# Patient Record
Sex: Male | Born: 1960
Health system: Southern US, Community
[De-identification: ages and names within clinical notes are randomized; demographics above are authoritative.]

## PROBLEM LIST (undated history)

## (undated) DIAGNOSIS — G35 Multiple sclerosis: Secondary | ICD-10-CM

## (undated) HISTORY — PX: NO PAST SURGERIES: SHX2092

---

## 2010-04-13 ENCOUNTER — Ambulatory Visit: Payer: Self-pay | Admitting: Internal Medicine

## 2011-11-30 ENCOUNTER — Ambulatory Visit: Payer: Self-pay | Admitting: Internal Medicine

## 2012-02-11 DIAGNOSIS — G35 Multiple sclerosis: Secondary | ICD-10-CM | POA: Insufficient documentation

## 2015-08-24 ENCOUNTER — Ambulatory Visit: Payer: 59

## 2015-08-24 ENCOUNTER — Ambulatory Visit
Admission: EM | Admit: 2015-08-24 | Discharge: 2015-08-24 | Disposition: A | Discharge status: Home / Self Care | Payer: 59 | Attending: Family Medicine | Admitting: Family Medicine

## 2015-08-24 ENCOUNTER — Encounter: Payer: Self-pay | Admitting: Emergency Medicine

## 2015-08-24 DIAGNOSIS — J40 Bronchitis, not specified as acute or chronic: Secondary | ICD-10-CM

## 2015-08-24 DIAGNOSIS — J0110 Acute frontal sinusitis, unspecified: Secondary | ICD-10-CM

## 2015-08-24 HISTORY — DX: Multiple sclerosis: G35

## 2015-08-24 MED ORDER — AZITHROMYCIN 250 MG PO TABS: ORAL_TABLET | ORAL | Status: DC

## 2015-08-24 MED ORDER — BENZONATATE 100 MG PO CAPS: 100.0000 mg | ORAL_CAPSULE | Freq: Three times a day (TID) | ORAL | Status: DC | PRN

## 2015-08-24 NOTE — Discharge Instructions (Signed)
Take medication as prescribed. Rest. Drink plenty of water.   Follow up with your primary care physician this week as needed. Return to Urgent care for new or worsening concerns.   Sinusitis Sinusitis is redness, soreness, and inflammation of the paranasal sinuses. Paranasal sinuses are air pockets within the bones of your face (beneath the eyes, the middle of the forehead, or above the eyes). In healthy paranasal sinuses, mucus is able to drain out, and air is able to circulate through them by way of your nose. However, when your paranasal sinuses are inflamed, mucus and air can become trapped. This can allow bacteria and other germs to grow and cause infection. Sinusitis can develop quickly and last only a short time (acute) or continue over a long period (chronic). Sinusitis that lasts for more than 12 weeks is considered chronic.  CAUSES  Causes of sinusitis include:  Allergies.  Structural abnormalities, such as displacement of the cartilage that separates your nostrils (deviated septum), which can decrease the air flow through your nose and sinuses and affect sinus drainage.  Functional abnormalities, such as when the small hairs (cilia) that line your sinuses and help remove mucus do not work properly or are not present. SIGNS AND SYMPTOMS  Symptoms of acute and chronic sinusitis are the same. The primary symptoms are pain and pressure around the affected sinuses. Other symptoms include:  Upper toothache.  Earache.  Headache.  Bad breath.  Decreased sense of smell and taste.  A cough, which worsens when you are lying flat.  Fatigue.  Fever.  Thick drainage from your nose, which often is green and may contain pus (purulent).  Swelling and warmth over the affected sinuses. DIAGNOSIS  Your health care provider will perform a physical exam. During the exam, your health care provider may:  Look in your nose for signs of abnormal growths in your nostrils (nasal  polyps).  Tap over the affected sinus to check for signs of infection.  View the inside of your sinuses (endoscopy) using an imaging device that has a light attached (endoscope). If your health care provider suspects that you have chronic sinusitis, one or more of the following tests may be recommended:  Allergy tests.  Nasal culture. A sample of mucus is taken from your nose, sent to a lab, and screened for bacteria.  Nasal cytology. A sample of mucus is taken from your nose and examined by your health care provider to determine if your sinusitis is related to an allergy. TREATMENT  Most cases of acute sinusitis are related to a viral infection and will resolve on their own within 10 days. Sometimes medicines are prescribed to help relieve symptoms (pain medicine, decongestants, nasal steroid sprays, or saline sprays).  However, for sinusitis related to a bacterial infection, your health care provider will prescribe antibiotic medicines. These are medicines that will help kill the bacteria causing the infection.  Rarely, sinusitis is caused by a fungal infection. In theses cases, your health care provider will prescribe antifungal medicine. For some cases of chronic sinusitis, surgery is needed. Generally, these are cases in which sinusitis recurs more than 3 times per year, despite other treatments. HOME CARE INSTRUCTIONS   Drink plenty of water. Water helps thin the mucus so your sinuses can drain more easily.  Use a humidifier.  Inhale steam 3 to 4 times a day (for example, sit in the bathroom with the shower running).  Apply a warm, moist washcloth to your face 3 to 4 times a  day, or as directed by your health care provider.  Use saline nasal sprays to help moisten and clean your sinuses.  Take medicines only as directed by your health care provider.  If you were prescribed either an antibiotic or antifungal medicine, finish it all even if you start to feel better. SEEK IMMEDIATE  MEDICAL CARE IF:  You have increasing pain or severe headaches.  You have nausea, vomiting, or drowsiness.  You have swelling around your face.  You have vision problems.  You have a stiff neck.  You have difficulty breathing. MAKE SURE YOU:   Understand these instructions.  Will watch your condition.  Will get help right away if you are not doing well or get worse. Document Released: 12/03/2005 Document Revised: 04/19/2014 Document Reviewed: 12/18/2011 [facility] Patient Information 2015 [place], [place]. This information is not intended to replace advice given to you by your health care provider. Make sure you discuss any questions you have with your health care provider.

## 2015-08-24 NOTE — ED Provider Notes (Signed)
[facility] Emergency Department Provider Note  ____________________________________________  Time seen: Approximately 8:14 AM  I have reviewed the triage vital signs and the nursing notes.   HISTORY  Chief Complaint Nasal Congestion   HPI Larry Diaz is a 54 y.o. male  presents with complaints of 2 weeks of runny nose, congestion and sinus pressure. Patient reports that intermittent over last several days with increased nasal drainage and cough. States he has chest congestion. Denies wheezes. Denies chest pain or shortness of breath. Denies fever. Reports continues to eat and drink well. Reports taking over-the-counter Mucinex without any relief. States also using Nettie pot which helps but no relief.   Past Medical History  Diagnosis Date  . MS (multiple sclerosis)     There are no active problems to display for this patient.   History reviewed. No pertinent past surgical history.  Current Outpatient Rx  Name  Route  Sig  Dispense  Refill  . Dimethyl Fumarate (TECFIDERA PO)   Oral   Take 120 mg by mouth.           Allergies Review of patient's allergies indicates no known allergies.  History reviewed. No pertinent family history.  Social History Social History  Substance Use Topics  . Smoking status: Never Smoker   . Smokeless tobacco: None  . Alcohol Use: Yes    Review of Systems Constitutional: No fever/chills Eyes: No visual changes. ENT: positive for runny nose, cough and congestion.  Cardiovascular: Denies chest pain. Respiratory: Denies shortness of breath. Gastrointestinal: No abdominal pain.  No nausea, no vomiting.  No diarrhea.  No constipation. Genitourinary: Negative for dysuria. Musculoskeletal: Negative for back pain. Skin: Negative for rash. Neurological: Negative for headaches, focal weakness or numbness.  10-point ROS otherwise negative.  ____________________________________________   PHYSICAL  EXAM:  VITAL SIGNS: ED Triage Vitals  Enc Vitals Group     BP 08/24/15 0809 110/58 mmHg     Pulse Rate 08/24/15 0809 71     Resp 08/24/15 0809 20     Temp 08/24/15 0809 98.7 F (37.1 C)     Temp Source 08/24/15 0809 Tympanic     SpO2 08/24/15 0809 100 %     Weight 08/24/15 0809 187 lb (84.823 kg)     Height 08/24/15 0809 5' 9.5" (1.765 m)     Head Cir --      Peak Flow --      Pain Score 08/24/15 0814 0     Pain Loc --      Pain Edu? --      Excl. in GC? --     Constitutional: Alert and oriented. Well appearing and in no acute distress. Eyes: Conjunctivae are normal. PERRL. EOMI. Head: Atraumatic.  Ears: no erythema, normal TMs bilaterally.   Nose: mild clear rhinorrhea, bilateral nare turbinate edema, nares patent.   Mouth/Throat: Mucous membranes are moist.  Oropharynx non-erythematous. Neck: No stridor.  No cervical spine tenderness to palpation. Hematological/Lymphatic/Immunilogical: No cervical lymphadenopathy. Cardiovascular: Normal rate, regular rhythm. Grossly normal heart sounds.  Good peripheral circulation. Respiratory: Normal respiratory effort.  No retractions. Mild bilateral base rhonchi, improves with cough, no wheezes, no rales.  Gastrointestinal: Soft and nontender. No distention. Normal Bowel sounds.  No abdominal bruits. No CVA tenderness. Musculoskeletal: No lower or upper extremity tenderness nor edema.  Neurologic:  Normal speech and language. No gross focal neurologic deficits are appreciated. No gait instability. Skin:  Skin is warm, dry and intact. No rash noted.  Psychiatric: Mood and affect are normal. Speech and behavior are normal.  ____________________________________________   LABS (all labs ordered are listed, but only abnormal results are displayed)  Labs Reviewed - No data to display  RADIOLOGY  EXAM: CHEST 2 VIEW  COMPARISON: None.  FINDINGS: The heart size and mediastinal contours are within normal limits. Both lungs are  clear. No pneumothorax or pleural effusion is noted. The visualized skeletal structures are unremarkable.  IMPRESSION: No active cardiopulmonary disease.   Electronically Signed By: Lupita Raider, M.D. On: 08/24/2015 08:35  I, Renford Dills, personally viewed and evaluated these images (plain radiographs) as part of my medical decision making.  _______________________________________   INITIAL IMPRESSION / ASSESSMENT AND PLAN / ED COURSE  Pertinent labs & imaging results that were available during my care of the patient were reviewed by me and considered in my medical decision making (see chart for details).  Very well appearing. No acute distress. Presents for runny nose, congestion, sinus pressure and intermittent cough. Chest xray negative for acute cardiopulmonary disease. Will treat sinusitis and bronchitis with azithromycin and prn tessalon perles. Discussed follow up and return parameters.Follow up with PCP this week as needed. Patient verbalized understanding and agreed to plan.  ____________________________________________   FINAL CLINICAL IMPRESSION(S) / ED DIAGNOSES  Final diagnoses:  Acute frontal sinusitis, recurrence not specified  Bronchitis       Renford Dills, NP 08/24/15 [phone]

## 2015-08-24 NOTE — ED Notes (Signed)
Pt with cold s/s x days and nasal congestion

## 2015-12-19 DIAGNOSIS — Z0120 Encounter for dental examination and cleaning without abnormal findings: Secondary | ICD-10-CM | POA: Diagnosis not present

## 2016-04-03 DIAGNOSIS — G35 Multiple sclerosis: Secondary | ICD-10-CM | POA: Diagnosis not present

## 2016-04-03 DIAGNOSIS — Z79899 Other long term (current) drug therapy: Secondary | ICD-10-CM | POA: Diagnosis not present

## 2016-06-14 ENCOUNTER — Other Ambulatory Visit: Payer: Self-pay | Admitting: Pharmacist

## 2016-07-19 ENCOUNTER — Other Ambulatory Visit: Payer: Self-pay | Admitting: Pharmacist

## 2016-07-19 MED ORDER — INTERFERON BETA-1A 44 MCG/0.5ML ~~LOC~~ SOSY: 44.0000 ug | PREFILLED_SYRINGE | SUBCUTANEOUS | 6 refills | Status: DC

## 2016-07-20 ENCOUNTER — Ambulatory Visit (HOSPITAL_BASED_OUTPATIENT_CLINIC_OR_DEPARTMENT_OTHER): Payer: Self-pay | Admitting: Pharmacist

## 2016-07-20 DIAGNOSIS — G35 Multiple sclerosis: Secondary | ICD-10-CM

## 2016-07-20 MED FILL — REBIF 44 MCG/0.5ML SOSY: 44 | 28 days supply | Qty: 6 | Fill #0

## 2016-07-20 NOTE — Progress Notes (Signed)
   S: Patient presents today to the [facility].  Patient is currently taking Rebif for multiple sclerosis. Patient is managed by Dr. Dan Humphreys for this. He reports that the Rebif is controlling his MS with no recent relapses. He has tried the oral MS medications but could not tolerate them due to GI side effects.   Adherence: denies any missed doses. He rotates injection sites.  Dosing: Rebif (subQ): Target dose is 44 mcg 3 times weekly (separate doses by at least 48 hours)   Drug-drug interactions: none  Monitoring: CBC: WNL 03/2016 LFTs: WNL 03/2016 Thyroid function tests: last one WNL (2000) Injection site reactions: denies S/sx of malignancy: denies Neuropsychiatric symptoms: denies Thrombotic microangiopathy (monitor for new onset HTN, thrombocytopenia, or impaired renal dysfunction): denies    O:     No results found for: WBC, HGB, HCT, MCV, PLT    Chemistry   No results found for: NA, K, CL, CO2, BUN, CREATININE, GLU No results found for: CALCIUM, ALKPHOS, AST, ALT, BILITOT   See CareEverywhere  A/P: 1. Medication review: patient on Rebif for MS and is tolerating it well with no adverse effects and has improved control of his MS. Reviewed the medication with him, including the following: Rebif, interferon beta-1a, is an interferon indicated for the treatment of MS. Analgesics and/or antipyretics may help decrease flu-like symptoms on treatment days. No recommendation for any changes.    Juanita Craver, PharmD, BCPS, BCACP, CPP Clinical Pharmacist Practitioner  [facility] [phone]

## 2016-08-15 MED FILL — REBIF 44 MCG/0.5ML SOSY: 44 | 28 days supply | Qty: 6 | Fill #1

## 2016-09-12 MED FILL — REBIF 44 MCG/0.5ML SOSY: 44 | 28 days supply | Qty: 6 | Fill #2

## 2016-10-09 DIAGNOSIS — H52223 Regular astigmatism, bilateral: Secondary | ICD-10-CM | POA: Diagnosis not present

## 2016-10-17 MED FILL — REBIF 44 MCG/0.5ML SOSY: 44 | 28 days supply | Qty: 6 | Fill #3

## 2016-11-15 MED FILL — REBIF 44 MCG/0.5ML SOSY: 44 | 28 days supply | Qty: 6 | Fill #4

## 2016-12-12 MED FILL — REBIF 44 MCG/0.5ML SOSY: 44 | 28 days supply | Qty: 6 | Fill #5

## 2017-01-01 DIAGNOSIS — Z79899 Other long term (current) drug therapy: Secondary | ICD-10-CM | POA: Diagnosis not present

## 2017-01-01 DIAGNOSIS — G35 Multiple sclerosis: Secondary | ICD-10-CM | POA: Diagnosis not present

## 2017-01-09 MED FILL — REBIF 44 MCG/0.5ML SOSY: 44 | 28 days supply | Qty: 6 | Fill #6

## 2017-02-05 ENCOUNTER — Other Ambulatory Visit: Payer: Self-pay | Admitting: Pharmacist

## 2017-02-05 MED ORDER — INTERFERON BETA-1A 44 MCG/0.5ML ~~LOC~~ SOSY: 44.0000 ug | PREFILLED_SYRINGE | SUBCUTANEOUS | 11 refills | Status: DC

## 2017-02-05 MED FILL — REBIF 44 MCG/0.5ML SOSY: 44 | 28 days supply | Qty: 6 | Fill #0 | Status: TO

## 2017-03-13 MED FILL — REBIF 44 MCG/0.5ML SOSY: 44 | 28 days supply | Qty: 6 | Fill #0

## 2017-04-10 MED FILL — REBIF 44 MCG/0.5ML SOSY: 44 | 28 days supply | Qty: 6 | Fill #1

## 2017-05-14 MED FILL — REBIF 44 MCG/0.5ML SOSY: 44 | 28 days supply | Qty: 6 | Fill #2

## 2017-06-13 MED FILL — REBIF 44 MCG/0.5ML SOSY: 44 | 28 days supply | Qty: 6 | Fill #3

## 2017-07-09 MED FILL — REBIF 44 MCG/0.5ML SOSY: 44 | 28 days supply | Qty: 6 | Fill #4

## 2017-08-15 MED FILL — REBIF 44 MCG/0.5ML SOSY: 44 | 28 days supply | Qty: 6 | Fill #5

## 2017-09-17 MED FILL — REBIF 44 MCG/0.5ML SOSY: 44 | 28 days supply | Qty: 6 | Fill #6

## 2017-10-28 MED FILL — REBIF 44 MCG/0.5ML SOSY: 44 | 28 days supply | Qty: 6 | Fill #7

## 2017-11-22 MED FILL — REBIF 44 MCG/0.5ML SOSY: 44 | 28 days supply | Qty: 6 | Fill #8

## 2017-12-05 DIAGNOSIS — M545 Low back pain: Secondary | ICD-10-CM | POA: Diagnosis not present

## 2017-12-05 DIAGNOSIS — G35 Multiple sclerosis: Secondary | ICD-10-CM | POA: Diagnosis not present

## 2017-12-05 DIAGNOSIS — M549 Dorsalgia, unspecified: Secondary | ICD-10-CM | POA: Diagnosis not present

## 2017-12-25 MED FILL — REBIF 44 MCG/0.5ML SOSY: 44 | 28 days supply | Qty: 6 | Fill #9

## 2018-01-01 ENCOUNTER — Other Ambulatory Visit: Payer: Self-pay | Admitting: Pharmacist

## 2018-01-01 DIAGNOSIS — Z79899 Other long term (current) drug therapy: Secondary | ICD-10-CM | POA: Diagnosis not present

## 2018-01-01 DIAGNOSIS — G35 Multiple sclerosis: Secondary | ICD-10-CM | POA: Diagnosis not present

## 2018-01-01 MED ORDER — INTERFERON BETA-1A 44 MCG/0.5ML ~~LOC~~ SOSY: 44.0000 ug | PREFILLED_SYRINGE | SUBCUTANEOUS | 11 refills | Status: DC

## 2018-01-01 NOTE — Telephone Encounter (Signed)
Called patient to schedule an appointment for the [facility]. I was unable to reach the patient so I left a HIPAA-compliant message requesting that the patient return my call.   

## 2018-01-03 ENCOUNTER — Ambulatory Visit (HOSPITAL_BASED_OUTPATIENT_CLINIC_OR_DEPARTMENT_OTHER): Payer: 59 | Admitting: Pharmacist

## 2018-01-03 ENCOUNTER — Encounter: Payer: Self-pay | Admitting: Pharmacist

## 2018-01-03 DIAGNOSIS — Z79899 Other long term (current) drug therapy: Secondary | ICD-10-CM

## 2018-01-03 NOTE — Progress Notes (Signed)
   S: Patient presents today to the [facility].  Patient is currently taking Rebif for multiple sclerosis. Patient is managed by Dr. Dan Humphreys for this. He reports that the Rebif is controlling his MS with no recent relapses. Last visit with Dr. Dan Humphreys was two days ago.   He has tried the oral MS medications but could not tolerate them due to GI side effects.   Adherence: denies any missed doses. He rotates injection sites.  Dosing: Rebif (subQ): Target dose is 44 mcg 3 times weekly (separate doses by at least 48 hours)   Drug-drug interactions: none  Monitoring: CBC: WNL 12/2017 LFTs: WNL 12/2017 Thyroid function tests: last one WNL (2000) Injection site reactions: denies S/sx of malignancy: denies Neuropsychiatric symptoms: denies Thrombotic microangiopathy (monitor for new onset HTN, thrombocytopenia, or impaired renal dysfunction): denies    O:     No results found for: WBC, HGB, HCT, MCV, PLT    Chemistry   No results found for: NA, K, CL, CO2, BUN, CREATININE, GLU No results found for: CALCIUM, ALKPHOS, AST, ALT, BILITOT   See CareEverywhere  A/P: 1. Medication review: patient on Rebif for MS and is tolerating it well with no adverse effects and has improved control of his MS. Reviewed the medication with him, including the following: Rebif, interferon beta-1a, is an interferon indicated for the treatment of MS. Analgesics and/or antipyretics may help decrease flu-like symptoms on treatment days. No recommendation for any changes.    Alvino Blood, PharmD, BCPS, BCACP, CPP Clinical Pharmacist Practitioner  [facility] [phone]

## 2018-01-09 ENCOUNTER — Other Ambulatory Visit: Payer: Self-pay | Admitting: Internal Medicine

## 2018-01-09 ENCOUNTER — Encounter: Payer: Self-pay | Admitting: Internal Medicine

## 2018-01-13 ENCOUNTER — Ambulatory Visit (INDEPENDENT_AMBULATORY_CARE_PROVIDER_SITE_OTHER): Payer: 59 | Admitting: Internal Medicine

## 2018-01-13 ENCOUNTER — Encounter: Payer: Self-pay | Admitting: Internal Medicine

## 2018-01-13 VITALS — BP 110/70 | HR 89 | Ht 69.5 in | Wt 204.0 lb

## 2018-01-13 DIAGNOSIS — M62830 Muscle spasm of back: Secondary | ICD-10-CM | POA: Diagnosis not present

## 2018-01-13 DIAGNOSIS — G35 Multiple sclerosis: Secondary | ICD-10-CM

## 2018-01-13 MED ORDER — CYCLOBENZAPRINE HCL 10 MG PO TABS: 10.0000 mg | ORAL_TABLET | Freq: Every day | ORAL | 0 refills | Status: DC

## 2018-01-13 MED ORDER — CYCLOBENZAPRINE HCL 10 MG PO TABS
10.0000 mg | ORAL_TABLET | Freq: Every day | ORAL | 0 refills | Status: DC
Start: 2018-01-13 — End: 2018-01-13

## 2018-01-13 NOTE — Progress Notes (Signed)
Date:  01/13/2018   Name:  Larry Diaz   DOB:  [birth year]   MRN:  [id number]   Chief Complaint: Establish Care (Needs PCP. Has not had one in 15 years. See's [facility] to have MS checked. )  Back Pain  This is a new problem. The current episode started 1 to 4 weeks ago. The problem occurs daily. The problem has been gradually improving since onset. The pain is present in the lumbar spine. The pain does not radiate. Pertinent negatives include no abdominal pain, chest pain, fever or headaches.   MS - has been in remission on current treatment.  Followed by Neurology at [facility].   Review of Systems  Constitutional: Negative for chills, fever and unexpected weight change.  Eyes: Negative for visual disturbance.  Respiratory: Negative for chest tightness, shortness of breath and wheezing.   Cardiovascular: Negative for chest pain and palpitations.  Gastrointestinal: Negative for abdominal pain.  Musculoskeletal: Positive for back pain.  Neurological: Negative for dizziness and headaches.  Hematological: Negative for adenopathy.  Psychiatric/Behavioral: Negative for dysphoric mood.    Patient Active Problem List   Diagnosis Date Noted  . Multiple sclerosis (HCC) 02/11/2012    Prior to Admission medications   Medication Sig Start Date End Date Taking? Authorizing Provider  interferon beta-1a (REBIF) 44 MCG/0.5ML SOSY injection Inject 0.5 mLs (44 mcg total) into the skin 3 (three) times a week. 01/01/18  Yes Quentin Angst, MD  acetaminophen (TYLENOL) 500 MG tablet Take 1 tablet by mouth as needed.    [provider]    No Known Allergies  History reviewed. No pertinent surgical history.  Social History   Tobacco Use  . Smoking status: Never Smoker  . Smokeless tobacco: Never Used  Substance Use Topics  . Alcohol use: Yes    Comment: occasional  . Drug use: No     Medication list has been reviewed and updated.  PHQ 2/9 Scores 01/13/2018  PHQ - 2 Score 0     Physical Exam  Constitutional: He is oriented to person, place, and time. He appears well-developed. No distress.  HENT:  Head: Normocephalic and atraumatic.  Neck: Normal range of motion. Neck supple. No thyromegaly present.  Cardiovascular: Normal rate, regular rhythm and normal heart sounds.  Pulmonary/Chest: Effort normal. No respiratory distress. He has no wheezes.  Musculoskeletal: He exhibits no edema.       Lumbar back: He exhibits decreased range of motion, tenderness, pain and spasm.  Neurological: He is alert and oriented to person, place, and time. He has normal strength and normal reflexes. No cranial nerve deficit or sensory deficit.  Skin: Skin is warm and dry. No rash noted.  Psychiatric: He has a normal mood and affect. His behavior is normal. Thought content normal.  Nursing note and vitals reviewed.   BP 110/70   Pulse 89   Ht 5' 9.5" (1.765 m)   Wt 204 lb (92.5 kg)   SpO2 97%   BMI 29.69 kg/m   Assessment and Plan: 1. Multiple sclerosis (HCC) In remission  2. Muscle spasm of back Continue tylenol, heat - cyclobenzaprine (FLEXERIL) 10 MG tablet; Take 1 tablet (10 mg total) by mouth at bedtime.  Dispense: 30 tablet; Refill: 0    Meds ordered this encounter  Medications  . DISCONTD: cyclobenzaprine (FLEXERIL) 10 MG tablet    Sig: Take 1 tablet (10 mg total) by mouth at bedtime.    Dispense:  30 tablet  Refill:  0  . cyclobenzaprine (FLEXERIL) 10 MG tablet    Sig: Take 1 tablet (10 mg total) by mouth at bedtime.    Dispense:  30 tablet    Refill:  0    Partially dictated using Animal nutritionist. Any errors are unintentional.  Bari Edward, MD [facility]  01/13/2018

## 2018-01-22 ENCOUNTER — Encounter: Payer: Self-pay | Admitting: Internal Medicine

## 2018-01-22 ENCOUNTER — Ambulatory Visit (INDEPENDENT_AMBULATORY_CARE_PROVIDER_SITE_OTHER): Payer: 59 | Admitting: Internal Medicine

## 2018-01-22 VITALS — BP 104/64 | HR 63 | Ht 69.5 in | Wt 200.0 lb

## 2018-01-22 DIAGNOSIS — Z1159 Encounter for screening for other viral diseases: Secondary | ICD-10-CM

## 2018-01-22 DIAGNOSIS — Z1211 Encounter for screening for malignant neoplasm of colon: Secondary | ICD-10-CM

## 2018-01-22 DIAGNOSIS — Z125 Encounter for screening for malignant neoplasm of prostate: Secondary | ICD-10-CM

## 2018-01-22 DIAGNOSIS — Z0000 Encounter for general adult medical examination without abnormal findings: Secondary | ICD-10-CM | POA: Diagnosis not present

## 2018-01-22 DIAGNOSIS — G35 Multiple sclerosis: Secondary | ICD-10-CM

## 2018-01-22 LAB — POCT URINALYSIS DIPSTICK
Bilirubin, UA: NEGATIVE
GLUCOSE UA: NEGATIVE
Ketones, UA: NEGATIVE
LEUKOCYTES UA: NEGATIVE
Nitrite, UA: NEGATIVE
Protein, UA: NEGATIVE
RBC UA: NEGATIVE
SPEC GRAV UA: 1.02 (ref 1.010–1.025)
UROBILINOGEN UA: 0.2 U/dL
pH, UA: 6 (ref 5.0–8.0)

## 2018-01-22 NOTE — Patient Instructions (Signed)

## 2018-01-22 NOTE — Progress Notes (Signed)
Date:  01/22/2018   Name:  Larry Diaz   DOB:  Jan 25, 1961   MRN:  [id number]   Chief Complaint: Annual Exam Larry Diaz is a 57 y.o. male who presents today for his Complete Annual Exam. He feels well. He reports exercising regularly with physical job. He reports he is sleeping well.    Review of Systems  Constitutional: Negative for chills, fever and unexpected weight change.  HENT: Negative for hearing loss and trouble swallowing.   Eyes: Negative for visual disturbance.  Respiratory: Negative for chest tightness, shortness of breath and wheezing.   Cardiovascular: Negative for chest pain and palpitations.  Gastrointestinal: Negative for abdominal pain, blood in stool, constipation and diarrhea.  Genitourinary: Negative for difficulty urinating, hematuria and urgency.  Musculoskeletal: Positive for back pain (intermittent low back ache). Negative for arthralgias and myalgias.  Skin: Negative for color change and rash.  Allergic/Immunologic: Negative for environmental allergies.  Neurological: Negative for dizziness and headaches.  Hematological: Negative for adenopathy.  Psychiatric/Behavioral: Negative for dysphoric mood and sleep disturbance.    Patient Active Problem List   Diagnosis Date Noted  . Multiple sclerosis (HCC) 02/11/2012    Prior to Admission medications   Medication Sig Start Date End Date Taking? Authorizing Provider  acetaminophen (TYLENOL) 500 MG tablet Take 1 tablet by mouth as needed.    [provider]  cyclobenzaprine (FLEXERIL) 10 MG tablet Take 1 tablet (10 mg total) by mouth at bedtime. 01/13/18   Reubin Milan, MD  interferon beta-1a (REBIF) 44 MCG/0.5ML SOSY injection Inject 0.5 mLs (44 mcg total) into the skin 3 (three) times a week. 01/01/18   Quentin Angst, MD    No Known Allergies  History reviewed. No pertinent surgical history.  Social History   Tobacco Use  . Smoking status: Never Smoker  . Smokeless tobacco:  Never Used  Substance Use Topics  . Alcohol use: Yes    Comment: occasional  . Drug use: No     Medication list has been reviewed and updated.  PHQ 2/9 Scores 01/13/2018  PHQ - 2 Score 0    Physical Exam  Constitutional: He is oriented to person, place, and time. He appears well-developed and well-nourished. No distress.  HENT:  Head: Normocephalic and atraumatic.  Right Ear: Tympanic membrane, external ear and ear canal normal.  Left Ear: Tympanic membrane, external ear and ear canal normal.  Nose: Nose normal.  Mouth/Throat: Uvula is midline and oropharynx is clear and moist.  Eyes: Conjunctivae and EOM are normal. Pupils are equal, round, and reactive to light.  Neck: Normal range of motion. Neck supple. Carotid bruit is not present. No thyromegaly present.  Cardiovascular: Normal rate, regular rhythm, normal heart sounds and intact distal pulses.  Pulmonary/Chest: Effort normal and breath sounds normal. No respiratory distress. He has no wheezes. Right breast exhibits no mass. Left breast exhibits no mass.  Abdominal: Soft. Normal appearance and bowel sounds are normal. There is no hepatosplenomegaly. There is no tenderness.  Musculoskeletal: Normal range of motion.  Lymphadenopathy:    He has no cervical adenopathy.  Neurological: He is alert and oriented to person, place, and time. He has normal reflexes.  Skin: Skin is warm, dry and intact. No rash noted.  Psychiatric: He has a normal mood and affect. His speech is normal and behavior is normal. Judgment and thought content normal.  Nursing note and vitals reviewed.   BP 104/64   Pulse 63  Ht 5' 9.5" (1.765 m)   Wt 200 lb (90.7 kg)   SpO2 97%   BMI 29.11 kg/m   Assessment and Plan: 1. Annual physical exam Normal exam - Lipid panel - TSH - POCT urinalysis dipstick - Basic metabolic panel  2. Prostate cancer screening DRE deferred - PSA  3. Colon cancer screening - Ambulatory referral to  Gastroenterology  4. Multiple sclerosis (HCC) Stable on current regimen  5. Need for hepatitis C screening test - Hepatitis C antibody   No orders of the defined types were placed in this encounter.   Partially dictated using Animal nutritionist. Any errors are unintentional.  Bari Edward, MD [facility]  01/22/2018

## 2018-01-23 ENCOUNTER — Telehealth: Payer: Self-pay

## 2018-01-23 ENCOUNTER — Other Ambulatory Visit: Payer: Self-pay

## 2018-01-23 ENCOUNTER — Encounter: Payer: Self-pay | Admitting: Internal Medicine

## 2018-01-23 DIAGNOSIS — E782 Mixed hyperlipidemia: Secondary | ICD-10-CM | POA: Insufficient documentation

## 2018-01-23 DIAGNOSIS — Z1211 Encounter for screening for malignant neoplasm of colon: Secondary | ICD-10-CM

## 2018-01-23 LAB — BASIC METABOLIC PANEL
BUN / CREAT RATIO: 14 (ref 9–20)
BUN: 12 mg/dL (ref 6–24)
CO2: 22 mmol/L (ref 20–29)
Calcium: 9 mg/dL (ref 8.7–10.2)
Chloride: 103 mmol/L (ref 96–106)
Creatinine, Ser: 0.85 mg/dL (ref 0.76–1.27)
GFR, EST AFRICAN AMERICAN: 114 mL/min/{1.73_m2} (ref >59)
GFR, EST NON AFRICAN AMERICAN: 99 mL/min/{1.73_m2} (ref >59)
Glucose: 82 mg/dL (ref 65–99)
Potassium: 4.3 mmol/L (ref 3.5–5.2)
Sodium: 141 mmol/L (ref 134–144)

## 2018-01-23 LAB — LIPID PANEL
CHOL/HDL RATIO: 5.2 ratio — AB (ref 0.0–5.0)
Cholesterol, Total: 162 mg/dL (ref 100–199)
HDL: 31 mg/dL — ABNORMAL LOW (ref >39)
LDL CALC: 101 mg/dL — AB (ref 0–99)
TRIGLYCERIDES: 151 mg/dL — AB (ref 0–149)
VLDL CHOLESTEROL CAL: 30 mg/dL (ref 5–40)

## 2018-01-23 LAB — HEPATITIS C ANTIBODY

## 2018-01-23 LAB — TSH: TSH: 2.73 u[IU]/mL (ref 0.450–4.500)

## 2018-01-23 LAB — PSA: PROSTATE SPECIFIC AG, SERUM: 2.7 ng/mL (ref 0.0–4.0)

## 2018-01-23 NOTE — Telephone Encounter (Signed)
Gastroenterology Pre-Procedure Review  Request Date: 02/10/18 Requesting Physician: Dr. Allegra Lai  PATIENT REVIEW QUESTIONS: The patient responded to the following health history questions as indicated:    1. Are you having any GI issues? no 2. Do you have a personal history of Polyps? no 3. Do you have a family history of Colon Cancer or Polyps? no 4. Diabetes Mellitus? no 5. Joint replacements in the past 12 months?no 6. Major health problems in the past 3 months?no 7. Any artificial heart valves, MVP, or defibrillator?no    MEDICATIONS & ALLERGIES:    Patient reports the following regarding taking any anticoagulation/antiplatelet therapy:   Plavix, Coumadin, Eliquis, Xarelto, Lovenox, Pradaxa, Brilinta, or Effient? no Aspirin? no  Patient confirms/reports the following medications:  Current Outpatient Medications  Medication Sig Dispense Refill  . acetaminophen (TYLENOL) 500 MG tablet Take 1 tablet by mouth as needed.    . cyclobenzaprine (FLEXERIL) 10 MG tablet Take 1 tablet (10 mg total) by mouth at bedtime. 30 tablet 0  . interferon beta-1a (REBIF) 44 MCG/0.5ML SOSY injection Inject 0.5 mLs (44 mcg total) into the skin 3 (three) times a week. 12 Syringe 11   No current facility-administered medications for this visit.     Patient confirms/reports the following allergies:  No Known Allergies  No orders of the defined types were placed in this encounter.   AUTHORIZATION INFORMATION Primary Insurance: 1D#: Group #:  Secondary Insurance: 1D#: Group #:  SCHEDULE INFORMATION: Date: 02/10/18 Time: Location:[facility]

## 2018-01-27 MED FILL — REBIF 44 MCG/0.5ML SOSY: 44 | 28 days supply | Qty: 6 | Fill #10

## 2018-01-30 ENCOUNTER — Telehealth: Payer: Self-pay | Admitting: Gastroenterology

## 2018-01-30 NOTE — Telephone Encounter (Signed)
Patient has been informed his colonoscopy date has been changed from 02/10/18 to 03/10/18 as his wife has requested.  Thanks [employer]

## 2018-01-30 NOTE — Telephone Encounter (Signed)
Please call Jasmine December (spouse) need to r/s colonoscopy for 02/10/18 and needs the date of 03/10/18.

## 2018-03-04 ENCOUNTER — Telehealth: Payer: Self-pay | Admitting: Gastroenterology

## 2018-03-04 NOTE — Telephone Encounter (Signed)
Patient had a death in the family and needs to cancel 03/10/18 Dr. Allegra Lai colonoscopy. He will call back to r/s.

## 2018-03-04 NOTE — Telephone Encounter (Signed)
Patients colonoscopy has been canceled with Trish.  He will call back to reschedule per telephone message from [place].

## 2018-03-07 MED FILL — REBIF 44 MCG/0.5ML SOSY: 44 | 28 days supply | Qty: 6 | Fill #0

## 2018-03-10 ENCOUNTER — Ambulatory Visit: Admit: 2018-03-10 | Payer: 59 | Admitting: Gastroenterology

## 2018-03-10 SURGERY — COLONOSCOPY WITH PROPOFOL
Anesthesia: General

## 2018-04-18 ENCOUNTER — Other Ambulatory Visit: Payer: Self-pay

## 2018-04-18 ENCOUNTER — Ambulatory Visit
Admission: EM | Admit: 2018-04-18 | Discharge: 2018-04-18 | Disposition: A | Discharge status: Home / Self Care | Payer: 59 | Attending: Emergency Medicine | Admitting: Emergency Medicine

## 2018-04-18 DIAGNOSIS — J0140 Acute pansinusitis, unspecified: Secondary | ICD-10-CM

## 2018-04-18 MED ORDER — FLUTICASONE PROPIONATE 50 MCG/ACT NA SUSP: 2.0000 | Freq: Every day | NASAL | 0 refills | Status: DC

## 2018-04-18 MED ORDER — AMOXICILLIN-POT CLAVULANATE 875-125 MG PO TABS: 1.0000 | ORAL_TABLET | Freq: Two times a day (BID) | ORAL | 0 refills | Status: DC

## 2018-04-18 NOTE — ED Provider Notes (Signed)
HPI  SUBJECTIVE:  Larry Diaz is a 57 y.o. male who presents with a "sinus infection" for the past 2 weeks.  He reports greenish nasal congestion, rhinorrhea, postnasal drip.  Reports cough productive of the same material as his nasal congestion.  Reports bilateral ear pain/fullness, sinus pain and pressure.  States that he got better and then got worse.  No fevers, upper dental pain, facial pain.  No wheezing, chest pain, shortness of breath.  He tried a Neti pot, Tylenol.  The Neti pot helps.  Symptoms are worse when he goes outside.  No antibiotics in the past month.  No antipyretic in the past 6 to 8 hours.  No allergy symptoms.  He has a past medical history of MS, hyperlipidemia.  No history of diabetes, hypertension, recurrent sinusitis, allergies.  TFT:DDUKGURK, Nyoka Cowden, MD   Past Medical History:  Diagnosis Date  . MS (multiple sclerosis) (HCC)     Past Surgical History:  Procedure Laterality Date  . NO PAST SURGERIES      Family History  Problem Relation Age of Onset  . Heart disease Mother   . COPD Father   . Lung cancer Maternal Grandmother     Social History   Tobacco Use  . Smoking status: Never Smoker  . Smokeless tobacco: Never Used  Substance Use Topics  . Alcohol use: Yes    Comment: occasional  . Drug use: No    No current facility-administered medications for this encounter.   Current Outpatient Medications:  .  acetaminophen (TYLENOL) 500 MG tablet, Take 1 tablet by mouth as needed., Disp: , Rfl:  .  cyclobenzaprine (FLEXERIL) 10 MG tablet, Take 1 tablet (10 mg total) by mouth at bedtime., Disp: 30 tablet, Rfl: 0 .  interferon beta-1a (REBIF) 44 MCG/0.5ML SOSY injection, Inject 0.5 mLs (44 mcg total) into the skin 3 (three) times a week., Disp: 12 Syringe, Rfl: 11 .  amoxicillin-clavulanate (AUGMENTIN) 875-125 MG tablet, Take 1 tablet by mouth 2 (two) times daily. X 7 days, Disp: 14 tablet, Rfl: 0 .  fluticasone (FLONASE) 50 MCG/ACT nasal spray, Place  2 sprays into both nostrils daily., Disp: 16 g, Rfl: 0  No Known Allergies   ROS  As noted in HPI.   Physical Exam  BP (!) 128/92 (BP Location: Left Arm)   Pulse 69   Temp 98 F (36.7 C) (Oral)   Resp 17   Ht 5\' 11"  (1.803 m)   Wt 195 lb (88.5 kg)   SpO2 100%   BMI 27.20 kg/m   Constitutional: Well developed, well nourished, no acute distress Eyes:  EOMI, conjunctiva normal bilaterally HENT: Normocephalic, atraumatic,mucus membranes moist..  Right TM normal.  Left TM obscured with cerumen.  Positive purulent nasal congestion with erythematous, swollen turbinates.  Positive maxillary and frontal sinus tenderness.  Positive cobblestoning.  No obvious postnasal drip. Respiratory: Normal inspiratory effort Cardiovascular: Normal rate GI: nondistended skin: No rash, skin intact Musculoskeletal: no deformities Neurologic: Alert & oriented x 3, no focal neuro deficits Psychiatric: Speech and behavior appropriate   ED Course   Medications - No data to display  No orders of the defined types were placed in this encounter.   No results found for this or any previous visit (from the past 24 hour(s)). No results found.  ED Clinical Impression  Acute non-recurrent pansinusitis   ED Assessment/Plan  Given duration of symptoms, and history of double sickening, will treat with Augmentin for sinusitis.  This appears to  be allergy mediated so advised Claritin-D, Allegra-D or Zyrtec-D in addition to continuing the saline nasal irrigation with a Neti pot, Flonase.  Follow-up with PMD as needed.  Discussed MDM, treatment plan, and plan for follow-up with patient. . patient agrees with plan.   Meds ordered this encounter  Medications  . fluticasone (FLONASE) 50 MCG/ACT nasal spray    Sig: Place 2 sprays into both nostrils daily.    Dispense:  16 g    Refill:  0  . amoxicillin-clavulanate (AUGMENTIN) 875-125 MG tablet    Sig: Take 1 tablet by mouth 2 (two) times daily. X 7  days    Dispense:  14 tablet    Refill:  0    *This clinic note was created using Scientist, clinical (histocompatibility and immunogenetics). Therefore, there may be occasional mistakes despite careful proofreading.   ?   Domenick Gong, MD 04/18/18 [phone]

## 2018-04-18 NOTE — ED Triage Notes (Signed)
Patient complains of sinus pain and pressure, coughing x 2 weeks. Patient states that he has tried a neti pot without relief.

## 2018-04-18 NOTE — Discharge Instructions (Addendum)
Try some Claritin-D, Allegra-D or Zyrtec-D in addition to continuing the saline nasal irrigation with your Neti pot, Flonase. Finish the Augmentin, which is the antibiotic, even if you feel better.

## 2018-04-22 MED FILL — REBIF 44 MCG/0.5ML SOSY: 44 | 28 days supply | Qty: 6 | Fill #1

## 2018-05-21 MED FILL — REBIF 44 MCG/0.5ML SOSY: 44 | 28 days supply | Qty: 6 | Fill #2

## 2018-06-30 MED FILL — REBIF 44 MCG/0.5ML SOSY: 44 | 28 days supply | Qty: 6 | Fill #3

## 2018-08-06 MED FILL — REBIF 44 MCG/0.5ML SOSY: 44 | 28 days supply | Qty: 6 | Fill #4

## 2018-09-03 MED FILL — REBIF 44 MCG/0.5ML SOSY: 44 | 28 days supply | Qty: 6 | Fill #5

## 2018-09-29 MED FILL — REBIF 44 MCG/0.5ML SOSY: 44 | 28 days supply | Qty: 6 | Fill #6

## 2018-11-04 MED FILL — REBIF 44 MCG/0.5ML SOSY: 44 | 28 days supply | Qty: 6 | Fill #7

## 2018-12-04 DIAGNOSIS — G35 Multiple sclerosis: Secondary | ICD-10-CM | POA: Diagnosis not present

## 2018-12-04 DIAGNOSIS — Z79899 Other long term (current) drug therapy: Secondary | ICD-10-CM | POA: Diagnosis not present

## 2018-12-04 LAB — HEPATIC FUNCTION PANEL
ALT: 34 (ref 10–40)
AST: 21 (ref 14–40)

## 2018-12-04 LAB — CBC AND DIFFERENTIAL
HCT: 48 (ref 41–53)
Hemoglobin: 15.7 (ref 13.5–17.5)
Platelets: 193 (ref 150–399)
WBC: 4.6

## 2018-12-22 MED FILL — REBIF 44 MCG/0.5ML SOSY: 44 | 28 days supply | Qty: 6 | Fill #8

## 2019-01-23 ENCOUNTER — Other Ambulatory Visit: Payer: Self-pay | Admitting: Pharmacist

## 2019-01-23 MED ORDER — INTERFERON BETA-1A 44 MCG/0.5ML ~~LOC~~ SOSY: 44.0000 ug | PREFILLED_SYRINGE | SUBCUTANEOUS | 11 refills | Status: DC

## 2019-01-27 MED FILL — REBIF 44 MCG/0.5ML SOSY: 44 | 28 days supply | Qty: 6 | Fill #0

## 2019-01-28 ENCOUNTER — Encounter: Payer: Self-pay | Admitting: Internal Medicine

## 2019-01-28 ENCOUNTER — Other Ambulatory Visit: Payer: Self-pay

## 2019-01-28 ENCOUNTER — Other Ambulatory Visit
Admission: RE | Admit: 2019-01-28 | Discharge: 2019-01-28 | Disposition: A | Discharge status: Home / Self Care | Payer: 59 | Attending: Internal Medicine | Admitting: Internal Medicine

## 2019-01-28 ENCOUNTER — Ambulatory Visit (INDEPENDENT_AMBULATORY_CARE_PROVIDER_SITE_OTHER): Payer: 59 | Admitting: Internal Medicine

## 2019-01-28 VITALS — BP 120/78 | HR 66 | Ht 71.0 in | Wt 202.2 lb

## 2019-01-28 DIAGNOSIS — G35 Multiple sclerosis: Secondary | ICD-10-CM | POA: Diagnosis not present

## 2019-01-28 DIAGNOSIS — E782 Mixed hyperlipidemia: Secondary | ICD-10-CM | POA: Diagnosis not present

## 2019-01-28 DIAGNOSIS — Z23 Encounter for immunization: Secondary | ICD-10-CM

## 2019-01-28 DIAGNOSIS — Z125 Encounter for screening for malignant neoplasm of prostate: Secondary | ICD-10-CM

## 2019-01-28 DIAGNOSIS — Z0000 Encounter for general adult medical examination without abnormal findings: Secondary | ICD-10-CM | POA: Insufficient documentation

## 2019-01-28 DIAGNOSIS — Z79899 Other long term (current) drug therapy: Secondary | ICD-10-CM | POA: Insufficient documentation

## 2019-01-28 DIAGNOSIS — Z1211 Encounter for screening for malignant neoplasm of colon: Secondary | ICD-10-CM | POA: Diagnosis not present

## 2019-01-28 LAB — PSA: Prostatic Specific Antigen: 2.47 ng/mL (ref 0.00–4.00)

## 2019-01-28 LAB — LIPID PANEL
Cholesterol: 152 mg/dL (ref 0–200)
HDL: 30 mg/dL — ABNORMAL LOW (ref >40)
LDL Cholesterol: 99 mg/dL (ref 0–99)
Total CHOL/HDL Ratio: 5.1 RATIO
Triglycerides: 114 mg/dL (ref <150)
VLDL: 23 mg/dL (ref 0–40)

## 2019-01-28 LAB — BASIC METABOLIC PANEL
Anion gap: 6 (ref 5–15)
BUN: 10 mg/dL (ref 6–20)
CO2: 26 mmol/L (ref 22–32)
Calcium: 8.6 mg/dL — ABNORMAL LOW (ref 8.9–10.3)
Chloride: 104 mmol/L (ref 98–111)
Creatinine, Ser: 0.8 mg/dL (ref 0.61–1.24)
GFR calc Af Amer: >60 mL/min (ref >60)
GFR calc non Af Amer: >60 mL/min (ref >60)
Glucose, Bld: 92 mg/dL (ref 70–99)
Potassium: 4 mmol/L (ref 3.5–5.1)
Sodium: 136 mmol/L (ref 135–145)

## 2019-01-28 LAB — POCT URINALYSIS DIPSTICK
Appearance: NEGATIVE
Bilirubin, UA: NEGATIVE
Glucose, UA: NEGATIVE
KETONES UA: NEGATIVE
Leukocytes, UA: NEGATIVE
NITRITE UA: NEGATIVE
PROTEIN UA: NEGATIVE
RBC UA: NEGATIVE
SPEC GRAV UA: 1.01 (ref 1.010–1.025)
Urobilinogen, UA: 0.2 E.U./dL
pH, UA: 5 (ref 5.0–8.0)

## 2019-01-28 LAB — TSH: TSH: 2.501 u[IU]/mL (ref 0.350–4.500)

## 2019-01-28 NOTE — Progress Notes (Signed)
Date:  01/28/2019   Name:  Larry Diaz   DOB:  1961-10-23   MRN:  [id number]   Chief Complaint: Annual Exam Larry Diaz is a 58 y.o. male who presents today for his Complete Annual Exam. He feels well. He reports exercising some at the gym. He reports he is sleeping well. He is overdue for a colonoscopy.  He is due for flu vaccine and Tdap.   Hyperlipidemia  This is a chronic problem. Pertinent negatives include no chest pain or shortness of breath. Current antihyperlipidemic treatment includes diet change.   MS - followed by [facility], on Rebif injections three times a week and doing well.  Recently had CDL renewed. His only sx was double vision which he has not recur.  Lab Results  Component Value Date   ALT 34 12/04/2018   AST 21 12/04/2018   Lab Results  Component Value Date   WBC 4.6 12/04/2018   HGB 15.7 12/04/2018   HCT 48 12/04/2018   PLT 193 12/04/2018    Review of Systems  Constitutional: Negative for appetite change, fatigue and unexpected weight change.  HENT: Positive for hearing loss.   Eyes: Negative for visual disturbance.  Respiratory: Negative for cough, chest tightness, shortness of breath and wheezing.   Cardiovascular: Negative for chest pain, palpitations and leg swelling.  Gastrointestinal: Negative for abdominal pain, constipation and diarrhea.  Genitourinary: Negative for dysuria, hematuria and urgency.  Musculoskeletal: Negative for arthralgias, joint swelling and neck pain.  Skin: Negative for color change and rash.  Allergic/Immunologic: Negative for environmental allergies.  Neurological: Negative for dizziness, weakness, light-headedness and headaches.  Psychiatric/Behavioral: Negative for dysphoric mood. The patient is not nervous/anxious.     Patient Active Problem List   Diagnosis Date Noted  . Mixed dyslipidemia 01/23/2018  . Multiple sclerosis (HCC) 02/11/2012    No Known Allergies  Past Surgical History:  Procedure  Laterality Date  . NO PAST SURGERIES      Social History   Tobacco Use  . Smoking status: Never Smoker  . Smokeless tobacco: Never Used  Substance Use Topics  . Alcohol use: Yes    Comment: occasional  . Drug use: No     Medication list has been reviewed and updated.  Current Meds  Medication Sig  . acetaminophen (TYLENOL) 500 MG tablet Take 1 tablet by mouth as needed.  Marland Kitchen ibuprofen (ADVIL,MOTRIN) 800 MG tablet Take by mouth.  . interferon beta-1a (REBIF) 44 MCG/0.5ML SOSY injection Inject 0.5 mLs (44 mcg total) into the skin 3 (three) times a week.  . interferon beta-1a (REBIF) 44 MCG/0.5ML SOSY injection INJECT 0.5 MLS (44 MCG TOTAL) INTO THE SKIN 3 (THREE) TIMES A WEEK    PHQ 2/9 Scores 01/28/2019 01/13/2018  PHQ - 2 Score 1 0   Wt Readings from Last 3 Encounters:  01/28/19 202 lb 3.2 oz (91.7 kg)  04/18/18 195 lb (88.5 kg)  01/22/18 200 lb (90.7 kg)    Physical Exam Vitals signs and nursing note reviewed.  Constitutional:      Appearance: Normal appearance. He is well-developed.  HENT:     Head: Normocephalic.     Right Ear: Tympanic membrane, ear canal and external ear normal. Decreased hearing noted.     Left Ear: Tympanic membrane, ear canal and external ear normal. Decreased hearing noted.     Nose: Nose normal.     Right Sinus: No maxillary sinus tenderness or frontal sinus tenderness.  Left Sinus: No maxillary sinus tenderness or frontal sinus tenderness.     Mouth/Throat:     Pharynx: Uvula midline. No posterior oropharyngeal erythema.  Eyes:     Conjunctiva/sclera: Conjunctivae normal.     Pupils: Pupils are equal, round, and reactive to light.  Neck:     Musculoskeletal: Normal range of motion and neck supple.     Thyroid: No thyromegaly.     Vascular: No carotid bruit.  Cardiovascular:     Rate and Rhythm: Normal rate and regular rhythm.     Pulses: Normal pulses.     Heart sounds: Normal heart sounds.  Pulmonary:     Effort: Pulmonary effort  is normal.     Breath sounds: Normal breath sounds. No wheezing.  Chest:     Breasts:        Right: No mass.        Left: No mass.  Abdominal:     General: Bowel sounds are normal.     Palpations: Abdomen is soft.     Tenderness: There is no abdominal tenderness.  Musculoskeletal: Normal range of motion.     Right lower leg: No edema.     Left lower leg: No edema.  Lymphadenopathy:     Cervical: No cervical adenopathy.  Skin:    General: Skin is warm and dry.  Neurological:     Mental Status: He is alert and oriented to person, place, and time.     Cranial Nerves: Cranial nerves are intact.     Sensory: Sensation is intact.     Motor: Motor function is intact.     Deep Tendon Reflexes: Reflexes are normal and symmetric.  Psychiatric:        Speech: Speech normal.        Behavior: Behavior normal.        Thought Content: Thought content normal.        Judgment: Judgment normal.     BP 120/78 (BP Location: Right Arm, Patient Position: Sitting, Cuff Size: Normal)   Pulse 66   Ht 5\' 11"  (1.803 m)   Wt 202 lb 3.2 oz (91.7 kg)   SpO2 98%   BMI 28.20 kg/m   Assessment and Plan: 1. Annual physical exam Normal exam - Basic metabolic panel - POCT urinalysis dipstick  2. Colon cancer screening refer - Ambulatory referral to Gastroenterology  3. Multiple sclerosis (HCC) Stable on current medication Followed by Neurology Recent CBC and LFTs normal  4. Mixed dyslipidemia Continue healthy diet, exercise - Lipid panel  5. Long-term use of high-risk medication Rebif - check TSH - TSH  6. Needs flu shot - Flu Vaccine QUAD 6+ mos PF IM (Fluarix Quad PF)  7. Need for Tdap vaccination - Tdap vaccine greater than or equal to 7yo IM  8. Prostate cancer screening DRE deferred to lack of sx - PSA   Partially dictated using Dragon software. Any errors are unintentional.  Bari Edward, MD [facility]  01/28/2019

## 2019-01-28 NOTE — Patient Instructions (Signed)
Tdap Vaccine (Tetanus, Diphtheria and Pertussis): What You Need to Know  1. Why get vaccinated?  Tetanus, diphtheria and pertussis are very serious diseases. Tdap vaccine can protect us from these diseases. And, Tdap vaccine given to pregnant women can protect newborn babies against pertussis..  TETANUS (Lockjaw) is rare in the [place] today. It causes painful muscle tightening and stiffness, usually all over the body.  · It can lead to tightening of muscles in the head and neck so you can't open your mouth, swallow, or sometimes even breathe. Tetanus kills about 1 out of 10 people who are infected even after receiving the best medical care.  DIPHTHERIA is also rare in the [place] today. It can cause a thick coating to form in the back of the throat.  · It can lead to breathing problems, heart failure, paralysis, and death.  PERTUSSIS (Whooping Cough) causes severe coughing spells, which can cause difficulty breathing, vomiting and disturbed sleep.  · It can also lead to weight loss, incontinence, and rib fractures. Up to 2 in 100 adolescents and 5 in 100 adults with pertussis are hospitalized or have complications, which could include pneumonia or death.  These diseases are caused by bacteria. Diphtheria and pertussis are spread from person to person through secretions from coughing or sneezing. Tetanus enters the body through cuts, scratches, or wounds.  Before vaccines, as many as 200,000 cases of diphtheria, 200,000 cases of pertussis, and hundreds of cases of tetanus, were reported in the [place] each year. Since vaccination began, reports of cases for tetanus and diphtheria have dropped by about 99% and for pertussis by about 80%.  2. Tdap vaccine  Tdap vaccine can protect adolescents and adults from tetanus, diphtheria, and pertussis. One dose of Tdap is routinely given at age 11 or 12. People who did not get Tdap at that age should get it as soon as possible.  Tdap is especially important  for healthcare professionals and anyone having close contact with a baby younger than 12 months.  Pregnant women should get a dose of Tdap during every pregnancy, to protect the newborn from pertussis. Infants are most at risk for severe, life-threatening complications from pertussis.  Another vaccine, called Td, protects against tetanus and diphtheria, but not pertussis. A Td booster should be given every 10 years. Tdap may be given as one of these boosters if you have never gotten Tdap before. Tdap may also be given after a severe cut or burn to prevent tetanus infection.  Your doctor or the person giving you the vaccine can give you more information.  Tdap may safely be given at the same time as other vaccines.  3. Some people should not get this vaccine  · A person who has ever had a life-threatening allergic reaction after a previous dose of any diphtheria, tetanus or pertussis containing vaccine, OR has a severe allergy to any part of this vaccine, should not get Tdap vaccine. Tell the person giving the vaccine about any severe allergies.  · Anyone who had coma or long repeated seizures within 7 days after a childhood dose of DTP or DTaP, or a previous dose of Tdap, should not get Tdap, unless a cause other than the vaccine was found. They can still get Td.  · Talk to your doctor if you:  ? have seizures or another nervous system problem,  ? had severe pain or swelling after any vaccine containing diphtheria, tetanus or pertussis,  ? ever had a condition   called Guillain-Barré Syndrome (GBS),  ? aren't feeling well on the day the shot is scheduled.  4. Risks  With any medicine, including vaccines, there is a chance of side effects. These are usually mild and go away on their own. Serious reactions are also possible but are rare.  Most people who get Tdap vaccine do not have any problems with it.  Mild problems following Tdap  (Did not interfere with activities)  · Pain where the shot was given (about 3 in 4  adolescents or 2 in 3 adults)  · Redness or swelling where the shot was given (about 1 person in 5)  · Mild fever of at least 100.4°F (up to about 1 in 25 adolescents or 1 in 100 adults)  · Headache (about 3 or 4 people in 10)  · Tiredness (about 1 person in 3 or 4)  · Nausea, vomiting, diarrhea, stomach ache (up to 1 in 4 adolescents or 1 in 10 adults)  · Chills, sore joints (about 1 person in 10)  · Body aches (about 1 person in 3 or 4)  · Rash, swollen glands (uncommon)  Moderate problems following Tdap  (Interfered with activities, but did not require medical attention)  · Pain where the shot was given (up to 1 in 5 or 6)  · Redness or swelling where the shot was given (up to about 1 in 16 adolescents or 1 in 12 adults)  · Fever over 102°F (about 1 in 100 adolescents or 1 in 250 adults)  · Headache (about 1 in 7 adolescents or 1 in 10 adults)  · Nausea, vomiting, diarrhea, stomach ache (up to 1 or 3 people in 100)  · Swelling of the entire arm where the shot was given (up to about 1 in 500).  Severe problems following Tdap  (Unable to perform usual activities; required medical attention)  · Swelling, severe pain, bleeding and redness in the arm where the shot was given (rare).  Problems that could happen after any vaccine:  · People sometimes faint after a medical procedure, including vaccination. Sitting or lying down for about 15 minutes can help prevent fainting, and injuries caused by a fall. Tell your doctor if you feel dizzy, or have vision changes or ringing in the ears.  · Some people get severe pain in the shoulder and have difficulty moving the arm where a shot was given. This happens very rarely.  · Any medication can cause a severe allergic reaction. Such reactions from a vaccine are very rare, estimated at fewer than 1 in a million doses, and would happen within a few minutes to a few hours after the vaccination.  As with any medicine, there is a very remote chance of a vaccine causing a serious  injury or death.  The safety of vaccines is always being monitored. For more information, visit: www.cdc.gov/vaccinesafety/  5. What if there is a serious problem?  What should I look for?  · Look for anything that concerns you, such as signs of a severe allergic reaction, very high fever, or unusual behavior.  Signs of a severe allergic reaction can include hives, swelling of the face and throat, difficulty breathing, a fast heartbeat, dizziness, and weakness. These would usually start a few minutes to a few hours after the vaccination.  What should I do?  · If you think it is a severe allergic reaction or other emergency that can't wait, call 9-1-1 or get the person to the nearest hospital. Otherwise,   call your doctor.  · Afterward, the reaction should be reported to the Vaccine Adverse Event Reporting System (VAERS). Your doctor might file this report, or you can do it yourself through the VAERS web site at www.vaers.hhs.gov, or by calling 1-800-822-7967.  VAERS does not give medical advice.  6. The National Vaccine Injury Compensation Program  The National Vaccine Injury Compensation Program (VICP) is a federal program that was created to compensate people who may have been injured by certain vaccines.  Persons who believe they may have been injured by a vaccine can learn about the program and about filing a claim by calling 1-800-338-2382 or visiting the VICP website at www.hrsa.gov/vaccinecompensation. There is a time limit to file a claim for compensation.  7. How can I learn more?  · Ask your doctor. He or she can give you the vaccine package insert or suggest other sources of information.  · Call your local or state health department.  · Contact the Centers for Disease Control and Prevention (CDC):  ? Call 1-800-232-4636 (1-800-CDC-INFO) or  ? Visit CDC's website at www.cdc.gov/vaccines  Vaccine Information Statement Tdap Vaccine (02/09/2014)  This information is not intended to replace advice given to you  by your health care provider. Make sure you discuss any questions you have with your health care provider.  Document Released: 06/03/2012 Document Revised: 07/21/2018 Document Reviewed: 07/21/2018  Elsevier Interactive Patient Education © 2019 Elsevier Inc.

## 2019-01-29 ENCOUNTER — Encounter: Payer: 59 | Admitting: Internal Medicine

## 2019-02-03 ENCOUNTER — Telehealth: Payer: Self-pay | Admitting: Gastroenterology

## 2019-02-03 NOTE — Telephone Encounter (Signed)
Call has been returned.  Patient has been provided with some dates that are available for his colonoscopy.  He will call back after he discusses with his wife.  Thanks [employer]

## 2019-02-03 NOTE — Telephone Encounter (Signed)
Pt is calling to schedule colonoscopy  °

## 2019-02-17 ENCOUNTER — Encounter: Payer: Self-pay | Admitting: *Deleted

## 2019-03-03 MED FILL — REBIF 44 MCG/0.5ML SOSY: 44 | 28 days supply | Qty: 6 | Fill #1

## 2019-03-20 ENCOUNTER — Encounter: Payer: Self-pay | Admitting: Pharmacist

## 2019-03-20 ENCOUNTER — Other Ambulatory Visit: Payer: Self-pay

## 2019-03-20 ENCOUNTER — Ambulatory Visit (INDEPENDENT_AMBULATORY_CARE_PROVIDER_SITE_OTHER): Payer: 59 | Admitting: Pharmacist

## 2019-03-20 DIAGNOSIS — Z79899 Other long term (current) drug therapy: Secondary | ICD-10-CM

## 2019-03-20 NOTE — Addendum Note (Signed)
Addended by: Santa Lighter on: 03/20/2019 09:42 AM   Modules accepted: Level of Service

## 2019-03-20 NOTE — Progress Notes (Signed)
   S: Patient is currently taking Rebif for multiple sclerosis. Patient is managed by Dr. Dan Humphreys for this. He reports that the Rebif is controlling his MS with no recent relapses. Last visit with Neurology was in December 2019.  Adherence: reports a missed dose or two over the course of a year  Dosing: Rebif (subQ): Target dose is 44 mcg 3 times weekly (separate doses by at least 48 hours)   Drug-drug interactions: none  Monitoring: CBC: WNL 11/2018 LFTs: WNL 11/2018 Thyroid function tests: last one WNL (2020) Injection site reactions: denies S/sx of malignancy: denies Neuropsychiatric symptoms: denies Thrombotic microangiopathy (monitor for new onset HTN, thrombocytopenia, or impaired renal dysfunction): denies    O:     Lab Results  Component Value Date   WBC 4.6 12/04/2018   HGB 15.7 12/04/2018   HCT 48 12/04/2018   PLT 193 12/04/2018      Chemistry      Component Value Date/Time   NA 136 01/28/2019 1629   NA 141 01/22/2018 0940   K 4.0 01/28/2019 1629   CL 104 01/28/2019 1629   CO2 26 01/28/2019 1629   BUN 10 01/28/2019 1629   BUN 12 01/22/2018 0940   CREATININE 0.80 01/28/2019 1629      Component Value Date/Time   CALCIUM 8.6 (L) 01/28/2019 1629   AST 21 12/04/2018   ALT 34 12/04/2018     See CareEverywhere  A/P: 1. Medication review: patient on Rebif for MS and is tolerating it well with no adverse effects and continued control of his MS. Reviewed the medication with him, including the following: Rebif, interferon beta-1a, is an interferon indicated for the treatment of MS. Analgesics and/or antipyretics may help decrease flu-like symptoms on treatment days. No recommendation for any changes.    Alvino Blood, PharmD, BCPS, BCACP, CPP Clinical Pharmacist Practitioner  [phone]

## 2019-03-24 MED FILL — REBIF 44 MCG/0.5ML SOSY: 44 | 28 days supply | Qty: 6 | Fill #2

## 2019-05-04 MED FILL — REBIF 44 MCG/0.5ML SOSY: 44 | 28 days supply | Qty: 6 | Fill #3

## 2019-06-09 MED FILL — REBIF 44 MCG/0.5ML SOSY: 44 | 28 days supply | Qty: 6 | Fill #4

## 2019-06-16 DIAGNOSIS — Z79899 Other long term (current) drug therapy: Secondary | ICD-10-CM | POA: Diagnosis not present

## 2019-06-16 DIAGNOSIS — G35 Multiple sclerosis: Secondary | ICD-10-CM | POA: Diagnosis not present

## 2019-07-16 MED FILL — REBIF 44 MCG/0.5ML SOSY: 44 | 28 days supply | Qty: 6 | Fill #5

## 2019-08-21 MED FILL — REBIF 44 MCG/0.5ML SOSY: 44 | 28 days supply | Qty: 6 | Fill #6

## 2019-09-24 MED FILL — REBIF 44 MCG/0.5ML SOSY: 44 | 28 days supply | Qty: 6 | Fill #7

## 2019-10-22 MED FILL — REBIF 44 MCG/0.5ML SOSY: 44 | 28 days supply | Qty: 6 | Fill #8

## 2019-11-26 MED FILL — REBIF 44 MCG/0.5ML SOSY: 44 | 28 days supply | Qty: 6 | Fill #9

## 2019-12-29 MED FILL — REBIF 44 MCG/0.5ML SOSY: 44 | 28 days supply | Qty: 6 | Fill #10

## 2020-02-01 DIAGNOSIS — G35 Multiple sclerosis: Secondary | ICD-10-CM | POA: Diagnosis not present

## 2020-02-02 ENCOUNTER — Ambulatory Visit (INDEPENDENT_AMBULATORY_CARE_PROVIDER_SITE_OTHER): Payer: 59 | Admitting: Internal Medicine

## 2020-02-02 ENCOUNTER — Other Ambulatory Visit
Admission: RE | Admit: 2020-02-02 | Discharge: 2020-02-02 | Disposition: A | Discharge status: Home / Self Care | Payer: 59 | Attending: Internal Medicine | Admitting: Internal Medicine

## 2020-02-02 ENCOUNTER — Other Ambulatory Visit: Payer: Self-pay

## 2020-02-02 ENCOUNTER — Encounter: Payer: Self-pay | Admitting: Internal Medicine

## 2020-02-02 VITALS — BP 110/68 | HR 78 | Temp 98.5°F | Ht 71.0 in | Wt 204.0 lb

## 2020-02-02 DIAGNOSIS — Z0000 Encounter for general adult medical examination without abnormal findings: Secondary | ICD-10-CM | POA: Diagnosis not present

## 2020-02-02 DIAGNOSIS — Z125 Encounter for screening for malignant neoplasm of prostate: Secondary | ICD-10-CM

## 2020-02-02 DIAGNOSIS — E782 Mixed hyperlipidemia: Secondary | ICD-10-CM | POA: Insufficient documentation

## 2020-02-02 DIAGNOSIS — G35 Multiple sclerosis: Secondary | ICD-10-CM

## 2020-02-02 DIAGNOSIS — Z1211 Encounter for screening for malignant neoplasm of colon: Secondary | ICD-10-CM | POA: Diagnosis not present

## 2020-02-02 LAB — LIPID PANEL
Cholesterol: 191 mg/dL (ref 0–200)
HDL: 32 mg/dL — ABNORMAL LOW (ref >40)
LDL Cholesterol: 135 mg/dL — ABNORMAL HIGH (ref 0–99)
Total CHOL/HDL Ratio: 6 RATIO
Triglycerides: 119 mg/dL (ref <150)
VLDL: 24 mg/dL (ref 0–40)

## 2020-02-02 LAB — CBC WITH DIFFERENTIAL/PLATELET
Abs Immature Granulocytes: 0.01 10*3/uL (ref 0.00–0.07)
Basophils Absolute: 0 10*3/uL (ref 0.0–0.1)
Basophils Relative: 1 %
Eosinophils Absolute: 0.1 10*3/uL (ref 0.0–0.5)
Eosinophils Relative: 2 %
HCT: 45.8 % (ref 39.0–52.0)
Hemoglobin: 15.5 g/dL (ref 13.0–17.0)
Immature Granulocytes: 0 %
Lymphocytes Relative: 19 %
Lymphs Abs: 1 10*3/uL (ref 0.7–4.0)
MCH: 28.8 pg (ref 26.0–34.0)
MCHC: 33.8 g/dL (ref 30.0–36.0)
MCV: 85 fL (ref 80.0–100.0)
Monocytes Absolute: 0.8 10*3/uL (ref 0.1–1.0)
Monocytes Relative: 14 %
Neutro Abs: 3.7 10*3/uL (ref 1.7–7.7)
Neutrophils Relative %: 64 %
Platelets: 212 10*3/uL (ref 150–400)
RBC: 5.39 MIL/uL (ref 4.22–5.81)
RDW: 12.3 % (ref 11.5–15.5)
WBC: 5.6 10*3/uL (ref 4.0–10.5)
nRBC: 0 % (ref 0.0–0.2)

## 2020-02-02 LAB — COMPREHENSIVE METABOLIC PANEL
ALT: 41 U/L (ref 0–44)
AST: 24 U/L (ref 15–41)
Albumin: 4.2 g/dL (ref 3.5–5.0)
Alkaline Phosphatase: 50 U/L (ref 38–126)
Anion gap: 5 (ref 5–15)
BUN: 13 mg/dL (ref 6–20)
CO2: 25 mmol/L (ref 22–32)
Calcium: 8.9 mg/dL (ref 8.9–10.3)
Chloride: 102 mmol/L (ref 98–111)
Creatinine, Ser: 1.06 mg/dL (ref 0.61–1.24)
GFR calc Af Amer: >60 mL/min (ref >60)
GFR calc non Af Amer: >60 mL/min (ref >60)
Glucose, Bld: 95 mg/dL (ref 70–99)
Potassium: 4.1 mmol/L (ref 3.5–5.1)
Sodium: 132 mmol/L — ABNORMAL LOW (ref 135–145)
Total Bilirubin: 0.7 mg/dL (ref 0.3–1.2)
Total Protein: 7.8 g/dL (ref 6.5–8.1)

## 2020-02-02 LAB — SEDIMENTATION RATE: Sed Rate: 2 mm/hr (ref 0–20)

## 2020-02-02 LAB — POCT URINALYSIS DIPSTICK
Bilirubin, UA: NEGATIVE
Blood, UA: NEGATIVE
Glucose, UA: NEGATIVE
Ketones, UA: NEGATIVE
Leukocytes, UA: NEGATIVE
Nitrite, UA: NEGATIVE
Protein, UA: NEGATIVE
Spec Grav, UA: 1.02 (ref 1.010–1.025)
Urobilinogen, UA: 0.2 E.U./dL
pH, UA: 6 (ref 5.0–8.0)

## 2020-02-02 LAB — PSA: Prostatic Specific Antigen: 3.93 ng/mL (ref 0.00–4.00)

## 2020-02-02 NOTE — Progress Notes (Signed)
Date:  02/02/2020   Name:  Larry Diaz   DOB:  November 01, 1961   MRN:  [id number]   Chief Complaint: Annual Exam Larry Diaz is a 59 y.o. male who presents today for his Complete Annual Exam. He feels well. 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 He reports he is sleeping well.   Colonoscopy - none Immunization History  Administered Date(s) Administered  . Influenza,inj,Quad PF,6+ Mos 01/28/2019  . Td 07/25/2005  . Tdap 01/28/2019   MS - seen yesterday by neurology.  He is doing well with no symptoms.  Neurology is considering changing his medication since he has been so stable.  He did not get any labs done.  His initial symptoms were vision changes and numbness on the side of his face.  HPI  Lab Results  Component Value Date   CREATININE 0.80 01/28/2019   BUN 10 01/28/2019   NA 136 01/28/2019   K 4.0 01/28/2019   CL 104 01/28/2019   CO2 26 01/28/2019   Lab Results  Component Value Date   CHOL 152 01/28/2019   HDL 30 (L) 01/28/2019   LDLCALC 99 01/28/2019   TRIG 114 01/28/2019   CHOLHDL 5.1 01/28/2019   Lab Results  Component Value Date   TSH 2.501 01/28/2019   No results found for: HGBA1C   Review of Systems  Constitutional: Negative for appetite change, chills, diaphoresis, fatigue and unexpected weight change.  HENT: Negative for hearing loss, tinnitus, trouble swallowing and voice change.   Eyes: Negative for visual disturbance.  Respiratory: Negative for choking, shortness of breath and wheezing.   Cardiovascular: Negative for chest pain, palpitations and leg swelling.  Gastrointestinal: Negative for abdominal pain, blood in stool, constipation and diarrhea.  Genitourinary: Negative for difficulty urinating, dysuria and frequency.  Musculoskeletal: Negative for arthralgias, back pain and myalgias.  Skin: Negative for color change and rash.  Allergic/Immunologic: Negative for environmental allergies.  Neurological: Negative for  dizziness, tremors, syncope, weakness and headaches.  Hematological: Negative for adenopathy.  Psychiatric/Behavioral: Negative for dysphoric mood and sleep disturbance.    Patient Active Problem List   Diagnosis Date Noted  . Mixed dyslipidemia 01/23/2018  . Multiple sclerosis ([place]) 02/11/2012    No Known Allergies  Past Surgical History:  Procedure Laterality Date  . NO PAST SURGERIES      Social History   Tobacco Use  . Smoking status: Never Smoker  . Smokeless tobacco: Never Used  Substance Use Topics  . Alcohol use: Yes    Comment: occasional  . Drug use: No     Medication list has been reviewed and updated.  Current Meds  Medication Sig  . acetaminophen (TYLENOL) 500 MG tablet Take 1 tablet by mouth as needed.  . interferon beta-1a (REBIF) 44 MCG/0.5ML SOSY injection Inject 0.5 mLs (44 mcg total) into the skin 3 (three) times a week.    PHQ 2/9 Scores 02/02/2020 01/28/2019 01/13/2018  PHQ - 2 Score 0 1 0  PHQ- 9 Score 1 - -    BP Readings from Last 3 Encounters:  02/02/20 110/68  01/28/19 120/78  04/18/18 (!) 128/92    Physical Exam Vitals and nursing note reviewed.  Constitutional:      Appearance: Normal appearance. He is well-developed.  HENT:     Head: Normocephalic.     Right Ear: Tympanic membrane, ear canal and external ear normal.     Left Ear: Tympanic membrane, ear canal and external  ear normal.     Nose: Nose normal.     Mouth/Throat:     Pharynx: Uvula midline.  Eyes:     Conjunctiva/sclera: Conjunctivae normal.     Pupils: Pupils are equal, round, and reactive to light.  Neck:     Thyroid: No thyromegaly.     Vascular: No carotid bruit.  Cardiovascular:     Rate and Rhythm: Normal rate and regular rhythm.     Heart sounds: Normal heart sounds.  Pulmonary:     Effort: Pulmonary effort is normal.     Breath sounds: Normal breath sounds. No wheezing.  Chest:     Breasts:        Right: No mass.        Left: No mass.  Abdominal:       General: Bowel sounds are normal.     Palpations: Abdomen is soft.     Tenderness: There is no abdominal tenderness.  Musculoskeletal:        General: Normal range of motion.     Cervical back: Normal range of motion and neck supple.     Right lower leg: No edema.     Left lower leg: No edema.  Lymphadenopathy:     Cervical: No cervical adenopathy.  Skin:    General: Skin is warm and dry.     Capillary Refill: Capillary refill takes less than 2 seconds.  Neurological:     General: No focal deficit present.     Mental Status: He is alert and oriented to person, place, and time.     Cranial Nerves: Cranial nerves are intact.     Sensory: Sensation is intact.     Motor: Motor function is intact.     Deep Tendon Reflexes:     Reflex Scores:      Bicep reflexes are 1+ on the right side and 1+ on the left side.      Patellar reflexes are 2+ on the right side and 2+ on the left side. Psychiatric:        Speech: Speech normal.        Behavior: Behavior normal.        Thought Content: Thought content normal.        Judgment: Judgment normal.     Wt Readings from Last 3 Encounters:  02/02/20 204 lb (92.5 kg)  01/28/19 202 lb 3.2 oz (91.7 kg)  04/18/18 195 lb (88.5 kg)    BP 110/68   Pulse 78   Temp 98.5 F (36.9 C) (Oral)   Ht 5\' 11"  (1.803 m)   Wt 204 lb (92.5 kg)   SpO2 97%   BMI 28.45 kg/m   Assessment and Plan: 1. Annual physical exam Normal exam Continue healthy diet, exercise - POCT urinalysis dipstick  2. Colon cancer screening Pt has not followed through the past 2 years - he promises to do it this time. - Ambulatory referral to Gastroenterology  3. Prostate cancer screening DRE deferred - PSA  4. Mixed dyslipidemia Check labs and advise - Comprehensive metabolic panel - Lipid panel  5. Multiple sclerosis (HCC) Stable with no current symptoms Continue follow up with Neurology - CBC with Differential/Platelet - Sedimentation rate   Partially  dictated using . Any errors are unintentional.  Animal nutritionist, MD [facility]  02/02/2020

## 2020-02-03 ENCOUNTER — Other Ambulatory Visit: Payer: Self-pay | Admitting: Pharmacist

## 2020-02-03 MED ORDER — REBIF 44 MCG/0.5ML ~~LOC~~ SOSY: 44.0000 ug | PREFILLED_SYRINGE | SUBCUTANEOUS | 11 refills | Status: DC

## 2020-02-03 MED FILL — REBIF 44 MCG/0.5ML SOSY: 44 | 28 days supply | Qty: 6 | Fill #0

## 2020-02-13 ENCOUNTER — Ambulatory Visit: Payer: 59 | Attending: Internal Medicine

## 2020-02-13 ENCOUNTER — Other Ambulatory Visit: Payer: Self-pay

## 2020-02-13 DIAGNOSIS — Z23 Encounter for immunization: Secondary | ICD-10-CM | POA: Insufficient documentation

## 2020-02-13 NOTE — Progress Notes (Signed)
   Covid-19 Vaccination Clinic  Name:  Larry Diaz    MRN: [id number] DOB: [birth year]  02/13/2020  Larry Diaz was observed post Covid-19 immunization for 15 minutes without incidence. He was provided with Vaccine Information Sheet and instruction to access the V-Safe system.   Larry Diaz was instructed to call 911 with any severe reactions post vaccine: Marland Kitchen Difficulty breathing  . Swelling of your face and throat  . A fast heartbeat  . A bad rash all over your body  . Dizziness and weakness    Immunizations Administered    Name Date Dose VIS Date Route   Moderna COVID-19 Vaccine 02/13/2020  5:09 PM 0.5 mL 11/17/2019 Intramuscular   Manufacturer: Moderna   Lot: [id number]   NDC: 04471-580-63

## 2020-02-25 MED FILL — REBIF 44 MCG/0.5ML SOSY: 44 | 28 days supply | Qty: 6 | Fill #1

## 2020-03-11 ENCOUNTER — Ambulatory Visit (HOSPITAL_BASED_OUTPATIENT_CLINIC_OR_DEPARTMENT_OTHER): Payer: 59 | Admitting: Pharmacist

## 2020-03-11 ENCOUNTER — Other Ambulatory Visit: Payer: Self-pay

## 2020-03-11 DIAGNOSIS — Z79899 Other long term (current) drug therapy: Secondary | ICD-10-CM

## 2020-03-11 NOTE — Progress Notes (Signed)
   S: Patient is currently taking Rebif for multiple sclerosis. Patient is managed by Dr. Elwyn Reach for this. Reports that the Rebif is controlling his MS with no recent relapses. Last visit with Neurology was in 02/01/20. Of note, he did report at that visit injection fatigue with occasional flu-like symptoms after injections.   His wife reports interest in other options and this was discussed at that visit. Other therapies that are not currently on our specialty formulary (Ocrevus, Tysabri, Kesimpta, and Mayzent) were discussed at that visit. The plan is to have an MRI in 6 months and assess for change at that time. Of note, he has tried Tecfidera but this was d/c secondary to GI irritation and flushing.   Adherence: confirms  Dosing: Rebif (subQ): Target dose is 44 mcg 3 times weekly (separate doses by at least 48 hours)  Drug-drug interactions: none  Monitoring: CBC: WNL 02/02/20 LFTs: WNL 06/16/19 Thyroid function tests: last one WNL (2020) Injection site reactions: denies S/sx of malignancy: denies Neuropsychiatric symptoms: denies Thrombotic microangiopathy (monitor for new onset HTN, thrombocytopenia, or impaired renal dysfunction): denies   O:   Lab Results  Component Value Date   WBC 5.6 02/02/2020   HGB 15.5 02/02/2020   HCT 45.8 02/02/2020   MCV 85.0 02/02/2020   PLT 212 02/02/2020      Chemistry      Component Value Date/Time   NA 132 (L) 02/02/2020 0934   NA 141 01/22/2018 0940   K 4.1 02/02/2020 0934   CL 102 02/02/2020 0934   CO2 25 02/02/2020 0934   BUN 13 02/02/2020 0934   BUN 12 01/22/2018 0940   CREATININE 1.06 02/02/2020 0934      Component Value Date/Time   CALCIUM 8.9 02/02/2020 0934   ALKPHOS 50 02/02/2020 0934   AST 24 02/02/2020 0934   ALT 41 02/02/2020 0934   BILITOT 0.7 02/02/2020 0934     See CareEverywhere  A/P: 1. Medication review: patient on Rebif for MS and is tolerating it okay with some occasional flu-like symptoms and continued  control of his MS. Reviewed the medication with him, including the following: Rebif, interferon beta-1a, is an interferon indicated for the treatment of MS. Analgesics and/or antipyretics may help decrease flu-like symptoms on treatment days. No recommendation for any changes. Of note, I did go over the fact that the newer medications being considered are not on formulary currently. I did emphasize that this could change.   Butch Penny, PharmD, CPP Clinical Pharmacist [facility] [phone]

## 2020-03-12 ENCOUNTER — Ambulatory Visit: Payer: 59 | Attending: Internal Medicine

## 2020-03-12 ENCOUNTER — Ambulatory Visit: Payer: 59

## 2020-03-12 DIAGNOSIS — Z23 Encounter for immunization: Secondary | ICD-10-CM

## 2020-03-12 NOTE — Progress Notes (Signed)
   Covid-19 Vaccination Clinic  Name:  Larry Diaz    MRN: [id number] DOB: [birth year]  03/12/2020  [name] was observed post Covid-19 immunization for 15 minutes without incident. He was provided with Vaccine Information Sheet and instruction to access the V-Safe system.   [name] was instructed to call 911 with any severe reactions post vaccine: Marland Kitchen Difficulty breathing  . Swelling of face and throat  . A fast heartbeat  . A bad rash all over body  . Dizziness and weakness   Immunizations Administered    Name Date Dose VIS Date Route   Moderna COVID-19 Vaccine 03/12/2020  1:54 PM 0.5 mL 11/17/2019 Intramuscular   Manufacturer: Gala Murdoch   Lot: [id number]   NDC: 19509-326-71

## 2020-03-24 MED FILL — REBIF 44 MCG/0.5ML SOSY: 44 | 28 days supply | Qty: 6 | Fill #2

## 2020-04-27 ENCOUNTER — Telehealth: Payer: Self-pay

## 2020-04-27 NOTE — Telephone Encounter (Signed)
Called and left a detailed msg asking pt to call [facility] and schedule his colonoscopy. Gave him their # on his VM.   CM

## 2020-06-24 MED FILL — REBIF 44 MCG/0.5ML SOSY: 44 | 28 days supply | Qty: 6 | Fill #5

## 2020-07-22 MED FILL — REBIF 44 MCG/0.5ML SOSY: 44 | 28 days supply | Qty: 6 | Fill #6

## 2020-08-09 ENCOUNTER — Other Ambulatory Visit: Payer: Self-pay | Admitting: Psychiatry

## 2020-08-09 DIAGNOSIS — G35 Multiple sclerosis: Secondary | ICD-10-CM

## 2020-08-18 MED FILL — REBIF 44 MCG/0.5ML SOSY: 44 | 28 days supply | Qty: 6 | Fill #7

## 2020-09-05 ENCOUNTER — Other Ambulatory Visit: Payer: Self-pay

## 2020-09-05 ENCOUNTER — Ambulatory Visit
Admission: RE | Admit: 2020-09-05 | Discharge: 2020-09-05 | Disposition: A | Discharge status: Home / Self Care | Payer: 59 | Source: Ambulatory Visit | Attending: Psychiatry | Admitting: Psychiatry

## 2020-09-05 DIAGNOSIS — G35 Multiple sclerosis: Secondary | ICD-10-CM | POA: Diagnosis not present

## 2020-09-15 MED FILL — REBIF 44 MCG/0.5ML SOSY: 44 | 28 days supply | Qty: 6 | Fill #8

## 2020-09-20 DIAGNOSIS — Z79899 Other long term (current) drug therapy: Secondary | ICD-10-CM | POA: Diagnosis not present

## 2020-09-20 DIAGNOSIS — Z23 Encounter for immunization: Secondary | ICD-10-CM | POA: Diagnosis not present

## 2020-09-20 DIAGNOSIS — G35 Multiple sclerosis: Secondary | ICD-10-CM | POA: Diagnosis not present

## 2020-10-31 ENCOUNTER — Other Ambulatory Visit: Payer: Self-pay

## 2020-10-31 ENCOUNTER — Encounter: Payer: Self-pay | Admitting: Internal Medicine

## 2020-10-31 DIAGNOSIS — R972 Elevated prostate specific antigen [PSA]: Secondary | ICD-10-CM

## 2020-11-01 ENCOUNTER — Other Ambulatory Visit: Payer: Self-pay

## 2020-11-01 DIAGNOSIS — R972 Elevated prostate specific antigen [PSA]: Secondary | ICD-10-CM

## 2020-11-04 ENCOUNTER — Other Ambulatory Visit: Payer: Self-pay | Admitting: Internal Medicine

## 2020-11-04 ENCOUNTER — Ambulatory Visit: Payer: 59 | Attending: Internal Medicine

## 2020-11-04 DIAGNOSIS — Z23 Encounter for immunization: Secondary | ICD-10-CM

## 2020-11-04 DIAGNOSIS — Z79899 Other long term (current) drug therapy: Secondary | ICD-10-CM | POA: Diagnosis not present

## 2020-11-04 DIAGNOSIS — R972 Elevated prostate specific antigen [PSA]: Secondary | ICD-10-CM | POA: Diagnosis not present

## 2020-11-04 NOTE — Progress Notes (Signed)
   Covid-19 Vaccination Clinic  Name:  Larry Diaz    MRN: [id number] DOB: November 19, 1961  11/04/2020  [name] was observed post Covid-19 immunization for 15 minutes without incident. He was provided with Vaccine Information Sheet and instruction to access the V-Safe system.   [name] was instructed to call 911 with any severe reactions post vaccine: Marland Kitchen Difficulty breathing  . Swelling of face and throat  . A fast heartbeat  . A bad rash all over body  . Dizziness and weakness   Immunizations Administered    No immunizations on file.

## 2020-11-05 LAB — PSA: Prostate Specific Ag, Serum: 2.7 ng/mL (ref 0.0–4.0)

## 2020-12-30 ENCOUNTER — Encounter: Payer: Self-pay | Admitting: Internal Medicine

## 2020-12-30 ENCOUNTER — Other Ambulatory Visit: Payer: Self-pay | Admitting: Internal Medicine

## 2020-12-30 DIAGNOSIS — Z1211 Encounter for screening for malignant neoplasm of colon: Secondary | ICD-10-CM

## 2021-02-03 ENCOUNTER — Encounter: Payer: 59 | Admitting: Internal Medicine

## 2021-03-15 ENCOUNTER — Encounter: Payer: Self-pay | Admitting: Internal Medicine

## 2021-03-15 ENCOUNTER — Ambulatory Visit (INDEPENDENT_AMBULATORY_CARE_PROVIDER_SITE_OTHER): Payer: 59 | Admitting: Internal Medicine

## 2021-03-15 ENCOUNTER — Other Ambulatory Visit: Payer: Self-pay

## 2021-03-15 VITALS — BP 108/72 | HR 70 | Temp 98.1°F | Ht 71.0 in | Wt 198.0 lb

## 2021-03-15 DIAGNOSIS — B360 Pityriasis versicolor: Secondary | ICD-10-CM | POA: Insufficient documentation

## 2021-03-15 DIAGNOSIS — Z1211 Encounter for screening for malignant neoplasm of colon: Secondary | ICD-10-CM | POA: Diagnosis not present

## 2021-03-15 DIAGNOSIS — Z125 Encounter for screening for malignant neoplasm of prostate: Secondary | ICD-10-CM | POA: Diagnosis not present

## 2021-03-15 DIAGNOSIS — G35 Multiple sclerosis: Secondary | ICD-10-CM | POA: Diagnosis not present

## 2021-03-15 DIAGNOSIS — E782 Mixed hyperlipidemia: Secondary | ICD-10-CM | POA: Diagnosis not present

## 2021-03-15 DIAGNOSIS — Z0000 Encounter for general adult medical examination without abnormal findings: Secondary | ICD-10-CM

## 2021-03-15 NOTE — Progress Notes (Signed)
Date:  03/15/2021   Name:  Larry Diaz   DOB:  September 09, 1961   MRN:  [id number]   Chief Complaint: Annual Exam  Larry Diaz is a 60 y.o. male who presents today for his Complete Annual Exam. He feels well. He reports exercising daily with his job duties. He reports he is sleeping well.   Colonoscopy: referred but patient declined.  Agrees to do Cologuard.  Immunization History  Administered Date(s) Administered  . Influenza,inj,Quad PF,6+ Mos 01/28/2019, 09/20/2020  . Moderna SARS-COV2 Booster Vaccination 11/04/2020  . Moderna Sars-Covid-2 Vaccination 02/13/2020, 03/12/2020  . Td 07/25/2005  . Tdap 01/28/2019    HPI MS - most recent visit with Neurology was in October.  His MRI and exam were stable.  The plan was transition from Interferon beta to Ofatumumab.  He has made the change and so far is doing well.  He injects at home monthly.  Lab Results  Component Value Date   CREATININE 1.06 02/02/2020   BUN 13 02/02/2020   NA 132 (L) 02/02/2020   K 4.1 02/02/2020   CL 102 02/02/2020   CO2 25 02/02/2020   Lab Results  Component Value Date   CHOL 191 02/02/2020   HDL 32 (L) 02/02/2020   LDLCALC 135 (H) 02/02/2020   TRIG 119 02/02/2020   CHOLHDL 6.0 02/02/2020   Lab Results  Component Value Date   TSH 2.501 01/28/2019   No results found for: HGBA1C Lab Results  Component Value Date   WBC 5.6 02/02/2020   HGB 15.5 02/02/2020   HCT 45.8 02/02/2020   MCV 85.0 02/02/2020   PLT 212 02/02/2020   Lab Results  Component Value Date   ALT 41 02/02/2020   AST 24 02/02/2020   ALKPHOS 50 02/02/2020   BILITOT 0.7 02/02/2020     Review of Systems  Constitutional: Negative for appetite change, chills, diaphoresis, fatigue and unexpected weight change.  HENT: Negative for hearing loss, tinnitus, trouble swallowing and voice change.   Eyes: Negative for visual disturbance.  Respiratory: Negative for choking, shortness of breath and wheezing.   Cardiovascular: Negative  for chest pain, palpitations and leg swelling.  Gastrointestinal: Negative for abdominal pain, blood in stool, constipation and diarrhea.  Genitourinary: Negative for difficulty urinating, dysuria, frequency and urgency.  Musculoskeletal: Negative for arthralgias, back pain and myalgias.  Skin: Negative for color change and rash.  Neurological: Negative for dizziness, syncope and headaches.  Hematological: Negative for adenopathy.  Psychiatric/Behavioral: Negative for dysphoric mood and sleep disturbance.    Patient Active Problem List   Diagnosis Date Noted  . Mixed dyslipidemia 01/23/2018  . Multiple sclerosis (HCC) 02/11/2012    No Known Allergies  Past Surgical History:  Procedure Laterality Date  . NO PAST SURGERIES      Social History   Tobacco Use  . Smoking status: Never Smoker  . Smokeless tobacco: Never Used  Vaping Use  . Vaping Use: Never used  Substance Use Topics  . Alcohol use: Yes    Comment: occasional  . Drug use: No     Medication list has been reviewed and updated.  Current Meds  Medication Sig  . Ofatumumab (KESIMPTA) 20 MG/0.4ML SOAJ every 30 (thirty) days.    PHQ 2/9 Scores 03/15/2021 02/02/2020 01/28/2019 01/13/2018  PHQ - 2 Score 0 0 1 0  PHQ- 9 Score 0 1 - -    GAD 7 : Generalized Anxiety Score 03/15/2021 02/02/2020  Nervous, Anxious, on Edge 0  0  Control/stop worrying 0 0  Worry too much - different things 0 0  Trouble relaxing 0 0  Restless 0 0  Easily annoyed or irritable 0 0  Afraid - awful might happen 0 0  Total GAD 7 Score 0 0  Anxiety Difficulty - Not difficult at all    BP Readings from Last 3 Encounters:  03/15/21 108/72  02/02/20 110/68  01/28/19 120/78    Physical Exam Vitals and nursing note reviewed.  Constitutional:      Appearance: Normal appearance. He is well-developed.  HENT:     Head: Normocephalic.     Right Ear: Tympanic membrane, ear canal and external ear normal.     Left Ear: Tympanic membrane, ear  canal and external ear normal.     Nose: Nose normal.  Eyes:     Conjunctiva/sclera: Conjunctivae normal.     Pupils: Pupils are equal, round, and reactive to light.  Neck:     Thyroid: No thyromegaly.     Vascular: No carotid bruit.  Cardiovascular:     Rate and Rhythm: Normal rate and regular rhythm.     Heart sounds: Normal heart sounds.  Pulmonary:     Effort: Pulmonary effort is normal.     Breath sounds: Normal breath sounds. No wheezing.  Chest:  Breasts:     Right: No mass.     Left: No mass.    Abdominal:     General: Bowel sounds are normal.     Palpations: Abdomen is soft.     Tenderness: There is no abdominal tenderness.  Musculoskeletal:        General: Normal range of motion.     Cervical back: Normal range of motion and neck supple.  Lymphadenopathy:     Cervical: No cervical adenopathy.  Skin:    General: Skin is warm and dry.     Findings: Lesion (flat hypopigmented areas on inner arm) present.  Neurological:     Mental Status: He is alert and oriented to person, place, and time.     Deep Tendon Reflexes: Reflexes are normal and symmetric.  Psychiatric:        Attention and Perception: Attention normal.        Mood and Affect: Mood normal.        Thought Content: Thought content normal.     Wt Readings from Last 3 Encounters:  03/15/21 198 lb (89.8 kg)  02/02/20 204 lb (92.5 kg)  01/28/19 202 lb 3.2 oz (91.7 kg)    BP 108/72   Pulse 70   Temp 98.1 F (36.7 C) (Oral)   Ht 5\' 11"  (1.803 m)   Wt 198 lb (89.8 kg)   SpO2 98%   BMI 27.62 kg/m   Assessment and Plan: 1. Annual physical exam Normal exam Up to date on vaccinations - CBC with Differential/Platelet - Comprehensive metabolic panel  2. Colon cancer screening - Cologuard  3. Prostate cancer screening DRE deferred - PSA  4. Multiple sclerosis (HCC) Doing well with stable disease; followed closely by Neurology  5. Mixed dyslipidemia Check labs and advise - Lipid  panel   Partially dictated using Dragon software. Any errors are unintentional.  , MD [facility]  03/15/2021

## 2021-03-16 LAB — CBC WITH DIFFERENTIAL/PLATELET
Basophils Absolute: 0 10*3/uL (ref 0.0–0.2)
Basos: 1 %
EOS (ABSOLUTE): 0.1 10*3/uL (ref 0.0–0.4)
Eos: 2 %
Hematocrit: 51.1 % — ABNORMAL HIGH (ref 37.5–51.0)
Hemoglobin: 17 g/dL (ref 13.0–17.7)
Immature Grans (Abs): 0 10*3/uL (ref 0.0–0.1)
Immature Granulocytes: 0 %
Lymphocytes Absolute: 2.3 10*3/uL (ref 0.7–3.1)
Lymphs: 29 %
MCH: 27.9 pg (ref 26.6–33.0)
MCHC: 33.3 g/dL (ref 31.5–35.7)
MCV: 84 fL (ref 79–97)
Monocytes Absolute: 0.8 10*3/uL (ref 0.1–0.9)
Monocytes: 9 %
Neutrophils Absolute: 4.7 10*3/uL (ref 1.4–7.0)
Neutrophils: 59 %
Platelets: 269 10*3/uL (ref 150–450)
RBC: 6.09 x10E6/uL — ABNORMAL HIGH (ref 4.14–5.80)
RDW: 12.8 % (ref 11.6–15.4)
WBC: 8 10*3/uL (ref 3.4–10.8)

## 2021-03-16 LAB — LIPID PANEL
Chol/HDL Ratio: 4.9 ratio (ref 0.0–5.0)
Cholesterol, Total: 175 mg/dL (ref 100–199)
HDL: 36 mg/dL — ABNORMAL LOW (ref >39)
LDL Chol Calc (NIH): 117 mg/dL — ABNORMAL HIGH (ref 0–99)
Triglycerides: 123 mg/dL (ref 0–149)
VLDL Cholesterol Cal: 22 mg/dL (ref 5–40)

## 2021-03-16 LAB — PSA: Prostate Specific Ag, Serum: 4.2 ng/mL — ABNORMAL HIGH (ref 0.0–4.0)

## 2021-03-16 LAB — COMPREHENSIVE METABOLIC PANEL
ALT: 22 IU/L (ref 0–44)
AST: 19 IU/L (ref 0–40)
Albumin/Globulin Ratio: 1.6 (ref 1.2–2.2)
Albumin: 4.7 g/dL (ref 3.8–4.9)
Alkaline Phosphatase: 67 IU/L (ref 44–121)
BUN/Creatinine Ratio: 13 (ref 9–20)
BUN: 13 mg/dL (ref 6–24)
Bilirubin Total: 0.4 mg/dL (ref 0.0–1.2)
CO2: 20 mmol/L (ref 20–29)
Calcium: 9.4 mg/dL (ref 8.7–10.2)
Chloride: 100 mmol/L (ref 96–106)
Creatinine, Ser: 1.02 mg/dL (ref 0.76–1.27)
Globulin, Total: 3 g/dL (ref 1.5–4.5)
Glucose: 90 mg/dL (ref 65–99)
Potassium: 4.5 mmol/L (ref 3.5–5.2)
Sodium: 140 mmol/L (ref 134–144)
Total Protein: 7.7 g/dL (ref 6.0–8.5)
eGFR: 85 mL/min/{1.73_m2} (ref >59)

## 2021-03-17 ENCOUNTER — Encounter: Payer: Self-pay | Admitting: Internal Medicine

## 2021-03-17 NOTE — Progress Notes (Signed)
Patient viewed results via MyChart.  Scheduled with [facility] on 04/04/2021 and seeing Dr. Artis Flock.  PSA lab flowsheet faxed today, 03/17/2021.

## 2021-04-03 DIAGNOSIS — Z1211 Encounter for screening for malignant neoplasm of colon: Secondary | ICD-10-CM | POA: Diagnosis not present

## 2021-04-03 LAB — COLOGUARD: Cologuard: NEGATIVE

## 2021-04-04 DIAGNOSIS — R3914 Feeling of incomplete bladder emptying: Secondary | ICD-10-CM | POA: Diagnosis not present

## 2021-04-04 DIAGNOSIS — N401 Enlarged prostate with lower urinary tract symptoms: Secondary | ICD-10-CM | POA: Diagnosis not present

## 2021-04-04 DIAGNOSIS — R972 Elevated prostate specific antigen [PSA]: Secondary | ICD-10-CM | POA: Diagnosis not present

## 2021-04-13 ENCOUNTER — Telehealth: Payer: Self-pay | Admitting: Internal Medicine

## 2021-04-13 NOTE — Telephone Encounter (Signed)
Left message for patient to return call.

## 2021-04-13 NOTE — Telephone Encounter (Signed)
Let patient know that Cologuard was negative.  Repeat in 3 years for screening purposes.

## 2021-04-13 NOTE — Telephone Encounter (Signed)
Patient's wife, Jasmine December (on [place]), notified and verbalized understanding.  Will inform patient.

## 2021-05-03 ENCOUNTER — Other Ambulatory Visit: Payer: Self-pay

## 2021-05-03 DIAGNOSIS — R0982 Postnasal drip: Secondary | ICD-10-CM | POA: Diagnosis not present

## 2021-05-03 DIAGNOSIS — J301 Allergic rhinitis due to pollen: Secondary | ICD-10-CM | POA: Diagnosis not present

## 2021-05-03 DIAGNOSIS — J329 Chronic sinusitis, unspecified: Secondary | ICD-10-CM | POA: Diagnosis not present

## 2021-05-03 DIAGNOSIS — J339 Nasal polyp, unspecified: Secondary | ICD-10-CM | POA: Diagnosis not present

## 2021-05-03 MED ORDER — XHANCE 93 MCG/ACT NA EXHU
INHALANT_SUSPENSION | NASAL | 12 refills | Status: DC
  Filled 2021-05-03 – 2021-05-30 (×3): qty 16, 30d supply, fill #0

## 2021-05-04 ENCOUNTER — Other Ambulatory Visit: Payer: Self-pay

## 2021-05-08 ENCOUNTER — Other Ambulatory Visit: Payer: Self-pay

## 2021-05-08 MED ORDER — PREDNISONE 10 MG PO TABS
ORAL_TABLET | ORAL | 0 refills | Status: DC
  Filled 2021-05-08: qty 21, 6d supply, fill #0

## 2021-05-09 ENCOUNTER — Other Ambulatory Visit: Payer: Self-pay

## 2021-05-12 ENCOUNTER — Other Ambulatory Visit: Payer: Self-pay

## 2021-05-12 DIAGNOSIS — B360 Pityriasis versicolor: Secondary | ICD-10-CM | POA: Diagnosis not present

## 2021-05-12 MED ORDER — SELENIUM SULFIDE 2.5 % EX LOTN
TOPICAL_LOTION | CUTANEOUS | 5 refills | Status: DC
  Filled 2021-05-12: qty 120, 30d supply, fill #0
  Filled 2021-07-17: qty 120, 30d supply, fill #1

## 2021-05-16 ENCOUNTER — Other Ambulatory Visit: Payer: Self-pay

## 2021-05-22 ENCOUNTER — Other Ambulatory Visit: Payer: Self-pay

## 2021-05-29 DIAGNOSIS — J330 Polyp of nasal cavity: Secondary | ICD-10-CM | POA: Diagnosis not present

## 2021-05-29 DIAGNOSIS — J320 Chronic maxillary sinusitis: Secondary | ICD-10-CM | POA: Diagnosis not present

## 2021-05-30 ENCOUNTER — Other Ambulatory Visit: Payer: Self-pay

## 2021-07-17 ENCOUNTER — Other Ambulatory Visit: Payer: Self-pay

## 2021-07-31 DIAGNOSIS — J320 Chronic maxillary sinusitis: Secondary | ICD-10-CM | POA: Diagnosis not present

## 2021-07-31 DIAGNOSIS — J330 Polyp of nasal cavity: Secondary | ICD-10-CM | POA: Diagnosis not present

## 2021-08-07 ENCOUNTER — Ambulatory Visit
Admission: RE | Admit: 2021-08-07 | Discharge: 2021-08-07 | Disposition: A | Discharge status: Home / Self Care | Payer: 59 | Source: Ambulatory Visit | Attending: Emergency Medicine | Admitting: Emergency Medicine

## 2021-08-07 ENCOUNTER — Other Ambulatory Visit: Payer: Self-pay

## 2021-08-07 VITALS — BP 146/88 | HR 59 | Temp 98.0°F | Resp 18

## 2021-08-07 DIAGNOSIS — L03012 Cellulitis of left finger: Secondary | ICD-10-CM

## 2021-08-07 MED ORDER — SULFAMETHOXAZOLE-TRIMETHOPRIM 800-160 MG PO TABS
1.0000 | ORAL_TABLET | Freq: Two times a day (BID) | ORAL | 0 refills | Status: AC
  Filled 2021-08-07: qty 14, 7d supply, fill #0

## 2021-08-07 NOTE — ED Provider Notes (Addendum)
Renaldo Fiddler    CSN: [id number] Arrival date & time: 08/07/21  3299      History   Chief Complaint Chief Complaint  Patient presents with   Abscess   APT 0815    HPI Larry Diaz is a 60 y.o. male.  Patient presents with 2-week history of of redness, tenderness, swelling beside his fingernail on his left middle finger.  He has a sore there but no drainage.  He denies fever, chills, or other symptoms.  No treatments attempted at home.  His medical history includes multiple sclerosis.  The history is provided by the patient and medical records.   Past Medical History:  Diagnosis Date   MS (multiple sclerosis) [facility])     Patient Active Problem List   Diagnosis Date Noted   Tinea versicolor 03/15/2021   Mixed dyslipidemia 01/23/2018   Multiple sclerosis (HCC) 02/11/2012    Past Surgical History:  Procedure Laterality Date   NO PAST SURGERIES         Home Medications    Prior to Admission medications   Medication Sig Start Date End Date Taking? Authorizing Provider  Ofatumumab (KESIMPTA) 20 MG/0.4ML SOAJ every 30 (thirty) days. 01/01/21  Yes [provider]  selenium sulfide (SELSUN) 2.5 % shampoo Apply to chest and back daily. Leave on for 10 minutes, then rinse. then use on weekends as maintenance 05/12/21  Yes   sulfamethoxazole-trimethoprim (BACTRIM DS) 800-160 MG tablet Take 1 tablet by mouth 2 (two) times daily for 7 days. 08/07/21 08/14/21 Yes Mickie Bail, NP  COVID-19 mRNA vaccine, Moderna, 100 MCG/0.5ML injection USE AS DIRECTED 11/04/20 11/04/21  Judyann Munson, MD  Fluticasone Propionate Timmothy Sours) 93 MCG/ACT EXHU One spray each nostril BID 05/03/21     predniSONE (DELTASONE) 10 MG tablet Take 6 tabs on day 1, 5 tabs on day 2, 4 tabs on day 3, 3 tabs on day 4, 2 tabs on day 5, 1 tab on day 6. Then stop. 05/08/21       Family History Family History  Problem Relation Age of Onset   Heart disease Mother    COPD Father    Lung cancer Maternal  Grandmother     Social History Social History   Tobacco Use   Smoking status: Never   Smokeless tobacco: Never  Vaping Use   Vaping Use: Never used  Substance Use Topics   Alcohol use: Yes    Comment: occasional   Drug use: No     Allergies   Patient has no known allergies.   Review of Systems Review of Systems  Constitutional:  Negative for chills and fever.  Respiratory:  Negative for cough and shortness of breath.   Cardiovascular:  Negative for chest pain and palpitations.  Skin:  Positive for color change and wound.  Neurological:  Negative for weakness and numbness.  All other systems reviewed and are negative.   Physical Exam Triage Vital Signs ED Triage Vitals  Enc Vitals Group     BP      Pulse      Resp      Temp      Temp src      SpO2      Weight      Height      Head Circumference      Peak Flow      Pain Score      Pain Loc      Pain Edu?  Excl. in GC?    No data found.  Updated Vital Signs BP (!) 146/88 (BP Location: Left Arm)   Pulse (!) 59   Temp 98 F (36.7 C) (Oral)   Resp 18   SpO2 97%   Visual Acuity Right Eye Distance:   Left Eye Distance:   Bilateral Distance:    Right Eye Near:   Left Eye Near:    Bilateral Near:     Physical Exam Vitals and nursing note reviewed.  Constitutional:      General: He is not in acute distress.    Appearance: He is well-developed.  HENT:     Head: Normocephalic and atraumatic.     Mouth/Throat:     Mouth: Mucous membranes are moist.  Eyes:     Conjunctiva/sclera: Conjunctivae normal.  Cardiovascular:     Rate and Rhythm: Normal rate and regular rhythm.     Heart sounds: Normal heart sounds.  Pulmonary:     Effort: Pulmonary effort is normal. No respiratory distress.     Breath sounds: Normal breath sounds.  Abdominal:     Palpations: Abdomen is soft.     Tenderness: There is no abdominal tenderness.  Musculoskeletal:        General: Normal range of motion.     Cervical  back: Neck supple.  Skin:    General: Skin is warm and dry.     Capillary Refill: Capillary refill takes less than 2 seconds.     Findings: Lesion present.     Comments: Area of erythematous edema with crusted healing lesion at base of left middle fingernail.  No drainage.  Neurological:     General: No focal deficit present.     Mental Status: He is alert and oriented to person, place, and time.     Sensory: No sensory deficit.     Motor: No weakness.     Gait: Gait normal.  Psychiatric:        Mood and Affect: Mood normal.        Behavior: Behavior normal.     UC Treatments / Results  Labs (all labs ordered are listed, but only abnormal results are displayed) Labs Reviewed - No data to display  EKG   Radiology No results found.  Procedures Procedures (including critical care time)  Medications Ordered in UC Medications - No data to display  Initial Impression / Assessment and Plan / UC Course  I have reviewed the triage vital signs and the nursing notes.  Pertinent labs & imaging results that were available during my care of the patient were reviewed by me and considered in my medical decision making (see chart for details).  Paronychia of left middle finger.  Needle aspiration with return of serosanguineous fluid.  Treating with Bactrim DS.  Wound care instructions and signs of worsening infection discussed with patient.  Instructed him to follow-up with his PCP or return here if he notes signs of worsening infection.  Education provided on paronychia.  Patient agrees to plan of care.   Final Clinical Impressions(s) / UC Diagnoses   Final diagnoses:  Paronychia of left middle finger     Discharge Instructions      Take the antibiotic as directed.  Keep your wound clean and dry.  Wash it gently twice a day with soap and water.  Apply an antibiotic cream twice a day.    Follow-up with your primary care provider or return here if you see signs of infection, such  as increased pain, redness, pus-like drainage, warmth, fever, chills, or other concerning symptoms.        ED Prescriptions     Medication Sig Dispense Auth. Provider   sulfamethoxazole-trimethoprim (BACTRIM DS) 800-160 MG tablet Take 1 tablet by mouth 2 (two) times daily for 7 days. 14 tablet Mickie Bail, NP      PDMP not reviewed this encounter.   Mickie Bail, NP 08/07/21 [id number]    Mickie Bail, NP 08/07/21 1135

## 2021-08-07 NOTE — ED Triage Notes (Addendum)
Patient c/o abscess x 2 weeks.   Patient states " I'm not really sure how this started, I work a lot with my hands and yesterday I bumped my finger on something and it felt sore".   Patient has abscess present to the middle finger on LFT finger, base of finger nail.   Patient endorses redness and increased pain.   Patient denies drainage. Patient denies difficulty moving finger.   History of MS.   Patient hasn't taken any medications for symptoms.

## 2021-08-07 NOTE — Discharge Instructions (Addendum)
Take the antibiotic as directed.  Keep your wound clean and dry.  Wash it gently twice a day with soap and water.  Apply an antibiotic cream twice a day.    Follow up with your primary care provider or return here if you see signs of infection, such as increased pain, redness, pus-like drainage, warmth, fever, chills, or other concerning symptoms.     

## 2021-08-25 ENCOUNTER — Other Ambulatory Visit (HOSPITAL_COMMUNITY): Payer: Self-pay

## 2021-08-26 ENCOUNTER — Other Ambulatory Visit (HOSPITAL_COMMUNITY): Payer: Self-pay

## 2021-08-28 ENCOUNTER — Other Ambulatory Visit (HOSPITAL_COMMUNITY): Payer: Self-pay

## 2021-08-29 ENCOUNTER — Other Ambulatory Visit (HOSPITAL_COMMUNITY): Payer: Self-pay

## 2021-08-30 ENCOUNTER — Other Ambulatory Visit (HOSPITAL_COMMUNITY): Payer: Self-pay

## 2021-08-30 MED ORDER — KESIMPTA 20 MG/0.4ML ~~LOC~~ SOAJ: SUBCUTANEOUS | 0 refills | Status: DC

## 2021-08-30 MED ORDER — KESIMPTA 20 MG/0.4ML ~~LOC~~ SOAJ
SUBCUTANEOUS | 12 refills | Status: DC
  Filled 2021-09-01 – 2021-09-07 (×2): qty 0.4, 28d supply, fill #0

## 2021-08-31 ENCOUNTER — Other Ambulatory Visit: Payer: Self-pay | Admitting: Internal Medicine

## 2021-08-31 ENCOUNTER — Other Ambulatory Visit (HOSPITAL_COMMUNITY): Payer: Self-pay

## 2021-08-31 NOTE — Telephone Encounter (Signed)
Requested medication (s) are due for refill today: see encounter   Requested medication (s) are on the active medication list: yes  Last refill:  09/20/20 #0.4 ml 12 refills  Future visit scheduled: yes in 6 months   Notes to clinic:  medication not assigned to a protocol. Signed 08/30/21 by B. Walker, PA do you want to refill Rx?     Requested Prescriptions  Pending Prescriptions Disp Refills   Ofatumumab (KESIMPTA) 20 MG/0.4ML SOAJ 0.4 mL 12    Sig: Inject 0.4 mls subcutaneously monthly starting at week 4     Off-Protocol Failed - 08/31/2021  8:53 AM      Failed - Medication not assigned to a protocol, review manually.      Passed - Valid encounter within last 12 months    Recent Outpatient Visits           5 months ago Annual physical exam   [facility] Reubin Milan, MD   1 year ago Encounter for medication review   [facility] Lois Huxley, Cornelius Moras, RPH-CPP   1 year ago Annual physical exam   [facility] Reubin Milan, MD   2 years ago Annual physical exam   [facility] Reubin Milan, MD   3 years ago Annual physical exam   [facility] Reubin Milan, MD       Future Appointments             In 6 months Judithann Graves Nyoka Cowden, MD [facility]

## 2021-09-01 ENCOUNTER — Other Ambulatory Visit (HOSPITAL_COMMUNITY): Payer: Self-pay

## 2021-09-04 ENCOUNTER — Other Ambulatory Visit (HOSPITAL_COMMUNITY): Payer: Self-pay

## 2021-09-05 ENCOUNTER — Other Ambulatory Visit (HOSPITAL_COMMUNITY): Payer: Self-pay

## 2021-09-06 ENCOUNTER — Telehealth: Payer: Self-pay | Admitting: Pharmacist

## 2021-09-06 NOTE — Telephone Encounter (Signed)
Called patient to schedule an appointment for the [employer] [facility] [employer] [facility]. I was unable to reach the patient so I left a HIPAA-compliant message requesting that the patient return my call.   Luke Van Ausdall, PharmD, BCACP, CPP Clinical Pharmacist [facility] [phone]  

## 2021-09-07 ENCOUNTER — Other Ambulatory Visit (HOSPITAL_COMMUNITY): Payer: Self-pay

## 2021-09-07 ENCOUNTER — Other Ambulatory Visit: Payer: Self-pay

## 2021-09-07 ENCOUNTER — Other Ambulatory Visit: Payer: Self-pay | Admitting: Pharmacist

## 2021-09-07 ENCOUNTER — Ambulatory Visit: Payer: 59 | Attending: Internal Medicine | Admitting: Pharmacist

## 2021-09-07 DIAGNOSIS — Z79899 Other long term (current) drug therapy: Secondary | ICD-10-CM

## 2021-09-07 MED ORDER — KESIMPTA 20 MG/0.4ML ~~LOC~~ SOAJ
SUBCUTANEOUS | 12 refills | Status: DC
  Filled 2021-09-07: qty 0.4, 28d supply, fill #0
  Filled 2021-10-23: qty 0.4, 30d supply, fill #1
  Filled 2021-10-30 – 2021-10-31 (×2): qty 0.4, 28d supply, fill #1
  Filled 2021-11-21: qty 0.4, 28d supply, fill #2
  Filled 2021-12-21: qty 0.4, 28d supply, fill #3
  Filled 2022-01-15: qty 0.4, 28d supply, fill #4
  Filled 2022-02-21: qty 0.4, 28d supply, fill #5
  Filled 2022-03-19: qty 0.4, 28d supply, fill #6
  Filled 2022-04-11: qty 0.4, 28d supply, fill #7
  Filled 2022-05-11: qty 0.4, 28d supply, fill #8
  Filled 2022-06-05: qty 0.4, 28d supply, fill #9

## 2021-09-07 NOTE — Progress Notes (Signed)
   S: Patient presents today for review of their specialty medication.   Patient is currently taking Kesimpta for MS. Patient is managed by PA Dan Humphreys for this.   Dosing: 20 mg once monthly   Adherence: confirms   Efficacy: reports that it's working well   Monitoring:  -HBV screening: completed  -S/sx of infection: none -S/sx of PML: none  Current adverse effects: none  O:     Lab Results  Component Value Date   WBC 8.0 03/15/2021   HGB 17.0 03/15/2021   HCT 51.1 (H) 03/15/2021   MCV 84 03/15/2021   PLT 269 03/15/2021      Chemistry      Component Value Date/Time   NA 140 03/15/2021 0901   K 4.5 03/15/2021 0901   CL 100 03/15/2021 0901   CO2 20 03/15/2021 0901   BUN 13 03/15/2021 0901   CREATININE 1.02 03/15/2021 0901      Component Value Date/Time   CALCIUM 9.4 03/15/2021 0901   ALKPHOS 67 03/15/2021 0901   AST 19 03/15/2021 0901   ALT 22 03/15/2021 0901   BILITOT 0.4 03/15/2021 0901       A/P: 1. Medication review: patient currently on Kesimpta for MS and is tolerating it well. Reviewed the medication with the patient. No questions or concerns at this time. No recommendation for any changes at this time.     Butch Penny, PharmD, P[place]y [place], CPP Clinical Pharmacist [facility] [phone]

## 2021-09-11 ENCOUNTER — Other Ambulatory Visit (HOSPITAL_COMMUNITY): Payer: Self-pay

## 2021-09-14 ENCOUNTER — Other Ambulatory Visit (HOSPITAL_COMMUNITY): Payer: Self-pay

## 2021-09-21 DIAGNOSIS — G35 Multiple sclerosis: Secondary | ICD-10-CM | POA: Diagnosis not present

## 2021-09-21 DIAGNOSIS — Z79899 Other long term (current) drug therapy: Secondary | ICD-10-CM | POA: Diagnosis not present

## 2021-09-21 DIAGNOSIS — Z5181 Encounter for therapeutic drug level monitoring: Secondary | ICD-10-CM | POA: Diagnosis not present

## 2021-10-02 DIAGNOSIS — R972 Elevated prostate specific antigen [PSA]: Secondary | ICD-10-CM | POA: Diagnosis not present

## 2021-10-02 DIAGNOSIS — Z79899 Other long term (current) drug therapy: Secondary | ICD-10-CM | POA: Diagnosis not present

## 2021-10-02 DIAGNOSIS — N401 Enlarged prostate with lower urinary tract symptoms: Secondary | ICD-10-CM | POA: Diagnosis not present

## 2021-10-02 DIAGNOSIS — R9721 Rising PSA following treatment for malignant neoplasm of prostate: Secondary | ICD-10-CM | POA: Diagnosis not present

## 2021-10-05 ENCOUNTER — Other Ambulatory Visit (HOSPITAL_COMMUNITY): Payer: Self-pay

## 2021-10-05 DIAGNOSIS — H524 Presbyopia: Secondary | ICD-10-CM | POA: Diagnosis not present

## 2021-10-17 ENCOUNTER — Other Ambulatory Visit (HOSPITAL_COMMUNITY): Payer: Self-pay

## 2021-10-23 ENCOUNTER — Other Ambulatory Visit (HOSPITAL_COMMUNITY): Payer: Self-pay

## 2021-10-26 ENCOUNTER — Other Ambulatory Visit (HOSPITAL_COMMUNITY): Payer: Self-pay

## 2021-10-30 ENCOUNTER — Other Ambulatory Visit (HOSPITAL_COMMUNITY): Payer: Self-pay

## 2021-10-31 ENCOUNTER — Other Ambulatory Visit (HOSPITAL_COMMUNITY): Payer: Self-pay

## 2021-11-01 ENCOUNTER — Other Ambulatory Visit (HOSPITAL_COMMUNITY): Payer: Self-pay

## 2021-11-02 ENCOUNTER — Other Ambulatory Visit (HOSPITAL_COMMUNITY): Payer: Self-pay

## 2021-11-21 ENCOUNTER — Other Ambulatory Visit (HOSPITAL_COMMUNITY): Payer: Self-pay

## 2021-11-27 ENCOUNTER — Other Ambulatory Visit (HOSPITAL_COMMUNITY): Payer: Self-pay

## 2021-12-19 ENCOUNTER — Other Ambulatory Visit (HOSPITAL_COMMUNITY): Payer: Self-pay

## 2021-12-21 ENCOUNTER — Encounter: Payer: Self-pay | Admitting: Emergency Medicine

## 2021-12-21 ENCOUNTER — Other Ambulatory Visit (HOSPITAL_COMMUNITY): Payer: Self-pay

## 2021-12-21 ENCOUNTER — Ambulatory Visit
Admission: EM | Admit: 2021-12-21 | Discharge: 2021-12-21 | Disposition: A | Discharge status: Home / Self Care | Payer: 59 | Attending: Emergency Medicine | Admitting: Emergency Medicine

## 2021-12-21 ENCOUNTER — Other Ambulatory Visit: Payer: Self-pay

## 2021-12-21 DIAGNOSIS — S81011A Laceration without foreign body, right knee, initial encounter: Secondary | ICD-10-CM

## 2021-12-21 DIAGNOSIS — Z23 Encounter for immunization: Secondary | ICD-10-CM | POA: Diagnosis not present

## 2021-12-21 MED ORDER — CEPHALEXIN 500 MG PO CAPS
500.0000 mg | ORAL_CAPSULE | Freq: Four times a day (QID) | ORAL | 0 refills | Status: DC
  Filled 2021-12-21: qty 28, 7d supply, fill #0

## 2021-12-21 MED ORDER — TETANUS-DIPHTH-ACELL PERTUSSIS 5-2.5-18.5 LF-MCG/0.5 IM SUSY
0.5000 mL | PREFILLED_SYRINGE | Freq: Once | INTRAMUSCULAR | Status: AC
  Administered 2021-12-21: 0.5 mL via INTRAMUSCULAR

## 2021-12-21 NOTE — ED Triage Notes (Addendum)
Pt here with right knee laceration 2 hours ago from a grinder. Bleeding controlled with pressure dressing applied by the patient. Pt due for a tetanus shot.

## 2021-12-21 NOTE — ED Provider Notes (Signed)
Larry Diaz    CSN: [id number] Arrival date & time: 12/21/21  1518      History   Chief Complaint Chief Complaint  Patient presents with   Laceration    HPI Larry Diaz is a 61 y.o. male.  Accompanied by his wife, patient presents with a laceration on his right knee that occurred 2 hours PTA.  The laceration occurred while using a grinder.  Bleeding controlled with direct pressure.  He denies numbness, weakness, paresthesias.  No other injury.  Last tetanus unknown.  His medical history includes multiple sclerosis.  The history is provided by the patient, the spouse and medical records.   Past Medical History:  Diagnosis Date   MS (multiple sclerosis) [facility])     Patient Active Problem List   Diagnosis Date Noted   Tinea versicolor 03/15/2021   Mixed dyslipidemia 01/23/2018   Multiple sclerosis ([place]) 02/11/2012    Past Surgical History:  Procedure Laterality Date   NO PAST SURGERIES         Home Medications    Prior to Admission medications   Medication Sig Start Date End Date Taking? Authorizing Provider  cephALEXin (KEFLEX) 500 MG capsule Take 1 capsule (500 mg total) by mouth 4 (four) times daily. 12/21/21  Yes Sharion Balloon, NP  Fluticasone Propionate Truett Perna) 93 MCG/ACT EXHU One spray each nostril BID 05/03/21     Ofatumumab (KESIMPTA) 20 MG/0.4ML SOAJ every 30 (thirty) days. 01/01/21   [provider]  Ofatumumab (KESIMPTA) 20 MG/0.4ML SOAJ Inject 0.4 mls subcutaneously at week 0, 1 and 2 09/20/20   Gar Gibbon, PA-C  Ofatumumab (KESIMPTA) 20 MG/0.4ML SOAJ Inject 0.4 mls subcutaneously monthly starting at week 4 09/07/21   Tresa Garter, MD  predniSONE (DELTASONE) 10 MG tablet Take 6 tabs on day 1, 5 tabs on day 2, 4 tabs on day 3, 3 tabs on day 4, 2 tabs on day 5, 1 tab on day 6. Then stop. 05/08/21     selenium sulfide (SELSUN) 2.5 % shampoo Apply to chest and back daily. Leave on for 10 minutes, then rinse. then use on weekends as  maintenance 05/12/21       Family History Family History  Problem Relation Age of Onset   Heart disease Mother    COPD Father    Lung cancer Maternal Grandmother     Social History Social History   Tobacco Use   Smoking status: Never   Smokeless tobacco: Never  Vaping Use   Vaping Use: Never used  Substance Use Topics   Alcohol use: Yes    Comment: occasional   Drug use: No     Allergies   Patient has no known allergies.   Review of Systems Review of Systems  Musculoskeletal:  Negative for gait problem and joint swelling.  Skin:  Positive for wound. Negative for color change.  Neurological:  Negative for weakness and numbness.  All other systems reviewed and are negative.   Physical Exam Triage Vital Signs ED Triage Vitals  Enc Vitals Group     BP 12/21/21 1528 133/78     Pulse Rate 12/21/21 1528 76     Resp 12/21/21 1528 18     Temp 12/21/21 1528 98.1 F (36.7 C)     Temp Source 12/21/21 1528 Oral     SpO2 12/21/21 1528 95 %     Weight --      Height --      Head Circumference --  Peak Flow --      Pain Score 12/21/21 1532 3     Pain Loc --      Pain Edu? --      Excl. in GC? --    No data found.  Updated Vital Signs BP 133/78 (BP Location: Left Arm)    Pulse 76    Temp 98.1 F (36.7 C) (Oral)    Resp 18    SpO2 95%   Visual Acuity Right Eye Distance:   Left Eye Distance:   Bilateral Distance:    Right Eye Near:   Left Eye Near:    Bilateral Near:     Physical Exam Vitals and nursing note reviewed.  Constitutional:      General: He is not in acute distress.    Appearance: He is well-developed. He is not ill-appearing.  Cardiovascular:     Rate and Rhythm: Normal rate and regular rhythm.     Heart sounds: Normal heart sounds.  Pulmonary:     Effort: Pulmonary effort is normal. No respiratory distress.     Breath sounds: Normal breath sounds.  Musculoskeletal:        General: No swelling or deformity. Normal range of motion.      Cervical back: Neck supple.  Skin:    General: Skin is warm and dry.     Capillary Refill: Capillary refill takes less than 2 seconds.     Findings: Lesion present.     Comments: 4 cm laceration on right knee. Bleeding controlled.   Neurological:     General: No focal deficit present.     Mental Status: He is alert and oriented to person, place, and time.     Sensory: No sensory deficit.     Motor: No weakness.     Gait: Gait normal.  Psychiatric:        Mood and Affect: Mood normal.        Behavior: Behavior normal.     UC Treatments / Results  Labs (all labs ordered are listed, but only abnormal results are displayed) Labs Reviewed - No data to display  EKG   Radiology No results found.  Procedures Laceration Repair  Date/Time: 12/21/2021 4:39 PM Performed by: Mickie Bail, NP Authorized by: Mickie Bail, NP   Consent:    Consent obtained:  Verbal   Consent given by:  Patient   Risks discussed:  Infection, pain, poor cosmetic result, poor wound healing and retained foreign body Universal protocol:    Procedure explained and questions answered to patient or proxy's satisfaction: yes   Anesthesia:    Anesthesia method:  Local infiltration   Local anesthetic:  Lidocaine 1% w/o epi Laceration details:    Location:  Leg   Leg location:  R knee   Length (cm):  4   Depth (mm):  5 Pre-procedure details:    Preparation:  Patient was prepped and draped in usual sterile fashion Exploration:    Hemostasis achieved with:  Direct pressure   Imaging outcome: foreign body not noted     Wound exploration: wound explored through full range of motion and entire depth of wound visualized   Treatment:    Area cleansed with:  Povidone-iodine   Amount of cleaning:  Extensive   Irrigation solution:  Sterile water   Irrigation method:  Syringe   Visualized foreign bodies/material removed: no   Skin repair:    Repair method:  Sutures   Suture size:  3-0  Suture material:   Nylon   Suture technique:  Simple interrupted   Number of sutures:  7 Approximation:    Approximation:  Close Repair type:    Repair type:  Simple Post-procedure details:    Dressing:  Antibiotic ointment and non-adherent dressing   Procedure completion:  Tolerated well, no immediate complications (including critical care time)  Medications Ordered in UC Medications  Tdap (BOOSTRIX) injection 0.5 mL (0.5 mLs Intramuscular Given 12/21/21 1632)    Initial Impression / Assessment and Plan / UC Course  I have reviewed the triage vital signs and the nursing notes.  Pertinent labs & imaging results that were available during my care of the patient were reviewed by me and considered in my medical decision making (see chart for details).  Laceration of right knee.  7 sutures.  Tetanus updated.  Patient declined x-ray today; x-ray not available in the urgent care today and patient declines being sent for outpatient radiology.  Treating with cephalexin.  Wound care instructions and signs of infection discussed with patient at length.  Education provided on laceration care.  Instructed patient to return here right away if he notes signs of infection.  Instructed him to return for suture removal in 7 to 10 days.  He agrees to plan of care.   Final Clinical Impressions(s) / UC Diagnoses   Final diagnoses:  Laceration of right knee, initial encounter     Discharge Instructions      Your stitches need to come out in 7 to 10 days.    Take the antibiotic as directed.  Keep your wound clean and dry.  Wash it gently twice a day with soap and water.  Apply an antibiotic cream and bandage twice a day for 2-3 days.  After 2-3 days, allow the wound to dry.   Return here if you see signs of infection, such as redness, pus-like drainage, warmth, fever, chills, or other concerning symptoms.        ED Prescriptions     Medication Sig Dispense Auth. Provider   cephALEXin (KEFLEX) 500 MG capsule  Take 1 capsule (500 mg total) by mouth 4 (four) times daily. 28 capsule Sharion Balloon, NP      PDMP not reviewed this encounter.   Sharion Balloon, NP 12/21/21 [phone]

## 2021-12-21 NOTE — Discharge Instructions (Addendum)
Your stitches need to come out in 7 to 10 days.    Take the antibiotic as directed.  Keep your wound clean and dry.  Wash it gently twice a day with soap and water.  Apply an antibiotic cream and bandage twice a day for 2-3 days.  After 2-3 days, allow the wound to dry.   Return here if you see signs of infection, such as redness, pus-like drainage, warmth, fever, chills, or other concerning symptoms.

## 2021-12-22 ENCOUNTER — Other Ambulatory Visit (HOSPITAL_COMMUNITY): Payer: Self-pay

## 2021-12-25 ENCOUNTER — Other Ambulatory Visit (HOSPITAL_COMMUNITY): Payer: Self-pay

## 2022-01-15 ENCOUNTER — Other Ambulatory Visit (HOSPITAL_COMMUNITY): Payer: Self-pay

## 2022-01-18 ENCOUNTER — Other Ambulatory Visit (HOSPITAL_COMMUNITY): Payer: Self-pay

## 2022-02-09 ENCOUNTER — Other Ambulatory Visit (HOSPITAL_COMMUNITY): Payer: Self-pay

## 2022-02-12 ENCOUNTER — Other Ambulatory Visit (HOSPITAL_COMMUNITY): Payer: Self-pay

## 2022-02-21 ENCOUNTER — Other Ambulatory Visit (HOSPITAL_COMMUNITY): Payer: Self-pay

## 2022-02-22 ENCOUNTER — Other Ambulatory Visit (HOSPITAL_COMMUNITY): Payer: Self-pay

## 2022-03-01 ENCOUNTER — Other Ambulatory Visit (HOSPITAL_COMMUNITY): Payer: Self-pay

## 2022-03-05 ENCOUNTER — Other Ambulatory Visit (HOSPITAL_COMMUNITY): Payer: Self-pay

## 2022-03-19 ENCOUNTER — Encounter: Payer: Self-pay | Admitting: Internal Medicine

## 2022-03-19 ENCOUNTER — Ambulatory Visit (INDEPENDENT_AMBULATORY_CARE_PROVIDER_SITE_OTHER): Payer: 59 | Admitting: Internal Medicine

## 2022-03-19 ENCOUNTER — Other Ambulatory Visit (HOSPITAL_COMMUNITY): Payer: Self-pay

## 2022-03-19 VITALS — BP 124/78 | HR 66 | Ht 71.0 in | Wt 203.0 lb

## 2022-03-19 DIAGNOSIS — R972 Elevated prostate specific antigen [PSA]: Secondary | ICD-10-CM

## 2022-03-19 DIAGNOSIS — E782 Mixed hyperlipidemia: Secondary | ICD-10-CM | POA: Diagnosis not present

## 2022-03-19 DIAGNOSIS — G35 Multiple sclerosis: Secondary | ICD-10-CM

## 2022-03-19 DIAGNOSIS — Z0000 Encounter for general adult medical examination without abnormal findings: Secondary | ICD-10-CM

## 2022-03-19 DIAGNOSIS — N400 Enlarged prostate without lower urinary tract symptoms: Secondary | ICD-10-CM | POA: Diagnosis not present

## 2022-03-19 NOTE — Progress Notes (Signed)
? ? ?Date:  03/19/2022  ? ?Name:  Larry Diaz   DOB:  04-26-1961   MRN:  [id number] ? ? ?Chief Complaint: Annual Exam ?Larry Diaz is a 61 y.o. male who presents today for his Complete Annual Exam. He feels well. He reports exercising. He reports he is sleeping fairly well.  ? ?Colonoscopy: cologuard 03/2021 neg ? ?Immunization History  ?Administered Date(s) Administered  ? Influenza,inj,Quad PF,6+ Mos 01/28/2019, 09/20/2020  ? Moderna SARS-COV2 Booster Vaccination 11/04/2020  ? Moderna Sars-Covid-2 Vaccination 02/13/2020, 03/12/2020  ? Td 07/25/2005  ? Tdap 01/28/2019, 12/21/2021  ? ?Health Maintenance Due  ?Topic Date Due  ? Zoster Vaccines- Shingrix (1 of 2) Never done  ?  ?Lab Results  ?Component Value Date  ? PSA1 4.2 (H) 03/15/2021  ? PSA1 2.7 11/04/2020  ? PSA1 2.7 01/22/2018  ? ? ?Benign Prostatic Hypertrophy ?This is a chronic (seen last year by Urology) problem. Irritative symptoms do not include frequency. Pertinent negatives include no chills or dysuria. He is sexually active. Past treatments include nothing.  ?MS - followed by [facility]. His disease is stable.  He is on a new once monthly injectable and doing well. ? ?Lab Results  ?Component Value Date  ? NA 140 03/15/2021  ? K 4.5 03/15/2021  ? CO2 20 03/15/2021  ? GLUCOSE 90 03/15/2021  ? BUN 13 03/15/2021  ? CREATININE 1.02 03/15/2021  ? CALCIUM 9.4 03/15/2021  ? EGFR 85 03/15/2021  ? GFRNONAA >60 02/02/2020  ? ?Lab Results  ?Component Value Date  ? CHOL 175 03/15/2021  ? HDL 36 (L) 03/15/2021  ? LDLCALC 117 (H) 03/15/2021  ? TRIG 123 03/15/2021  ? CHOLHDL 4.9 03/15/2021  ? ?Lab Results  ?Component Value Date  ? TSH 2.501 01/28/2019  ? ?No results found for: HGBA1C ?Lab Results  ?Component Value Date  ? WBC 8.0 03/15/2021  ? HGB 17.0 03/15/2021  ? HCT 51.1 (H) 03/15/2021  ? MCV 84 03/15/2021  ? PLT 269 03/15/2021  ? ?Lab Results  ?Component Value Date  ? ALT 22 03/15/2021  ? AST 19 03/15/2021  ? ALKPHOS 67 03/15/2021  ? BILITOT 0.4 03/15/2021   ? ?No results found for: 25OHVITD2, [place], VD25OH  ? ?Review of Systems  ?Constitutional:  Negative for appetite change, chills, diaphoresis, fatigue and unexpected weight change.  ?HENT:  Negative for hearing loss, tinnitus, trouble swallowing and voice change.   ?Eyes:  Negative for visual disturbance.  ?Respiratory:  Negative for choking, shortness of breath and wheezing.   ?Cardiovascular:  Negative for chest pain, palpitations and leg swelling.  ?Gastrointestinal:  Negative for abdominal pain, blood in stool, constipation and diarrhea.  ?Genitourinary:  Negative for difficulty urinating, dysuria and frequency.  ?Musculoskeletal:  Negative for arthralgias, back pain and myalgias.  ?Skin:  Negative for color change and rash.  ?Neurological:  Negative for dizziness, syncope and headaches.  ?Hematological:  Negative for adenopathy.  ?Psychiatric/Behavioral:  Negative for dysphoric mood and sleep disturbance. The patient is not nervous/anxious.   ? ?Patient Active Problem List  ? Diagnosis Date Noted  ? BPH with elevated PSA 03/19/2022  ? Tinea versicolor 03/15/2021  ? Mixed dyslipidemia 01/23/2018  ? Multiple sclerosis ([place]) 02/11/2012  ? ? ?No Known Allergies ? ?Past Surgical History:  ?Procedure Laterality Date  ? NO PAST SURGERIES    ? ? ?Social History  ? ?Tobacco Use  ? Smoking status: Never  ? Smokeless tobacco: Never  ?Vaping Use  ? Vaping Use:  Never used  ?Substance Use Topics  ? Alcohol use: Yes  ?  Comment: occasional  ? Drug use: No  ? ? ? ?Medication list has been reviewed and updated. ? ?Current Meds  ?Medication Sig  ? Ofatumumab (KESIMPTA) 20 MG/0.4ML SOAJ Inject 0.4 mls subcutaneously monthly starting at week 4  ? ? ? ?  03/15/2021  ?  8:22 AM 02/02/2020  ?  8:21 AM  ?GAD 7 : Generalized Anxiety Score  ?Nervous, Anxious, on Edge 0 0  ?Control/stop worrying 0 0  ?Worry too much - different things 0 0  ?Trouble relaxing 0 0  ?Restless 0 0  ?Easily annoyed or irritable 0 0  ?Afraid - awful might  happen 0 0  ?Total GAD 7 Score 0 0  ?Anxiety Difficulty  Not difficult at all  ? ? ? ?  03/15/2021  ?  8:22 AM  ?Depression screen PHQ 2/9  ?Decreased Interest 0  ?Down, Depressed, Hopeless 0  ?PHQ - 2 Score 0  ?Altered sleeping 0  ?Tired, decreased energy 0  ?Change in appetite 0  ?Feeling bad or failure about yourself  0  ?Trouble concentrating 0  ?Moving slowly or fidgety/restless 0  ?Suicidal thoughts 0  ?PHQ-9 Score 0  ? ? ?BP Readings from Last 3 Encounters:  ?03/19/22 124/78  ?12/21/21 133/78  ?08/07/21 (!) 146/88  ? ? ?Physical Exam ?Vitals and nursing note reviewed.  ?Constitutional:   ?   Appearance: Normal appearance. He is well-developed.  ?HENT:  ?   Head: Normocephalic.  ?   Right Ear: Tympanic membrane, ear canal and external ear normal.  ?   Left Ear: Tympanic membrane, ear canal and external ear normal.  ?   Nose: Nose normal.  ?Eyes:  ?   Conjunctiva/sclera: Conjunctivae normal.  ?   Pupils: Pupils are equal, round, and reactive to light.  ?Neck:  ?   Thyroid: No thyromegaly.  ?   Vascular: No carotid bruit.  ?Cardiovascular:  ?   Rate and Rhythm: Normal rate and regular rhythm.  ?   Heart sounds: Normal heart sounds.  ?Pulmonary:  ?   Effort: Pulmonary effort is normal.  ?   Breath sounds: Normal breath sounds. No wheezing.  ?Chest:  ?Breasts: ?   Right: No mass.  ?   Left: No mass.  ?Abdominal:  ?   General: Bowel sounds are normal.  ?   Palpations: Abdomen is soft.  ?   Tenderness: There is no abdominal tenderness.  ?Musculoskeletal:     ?   General: Normal range of motion.  ?   Cervical back: Normal range of motion and neck supple.  ?Lymphadenopathy:  ?   Cervical: No cervical adenopathy.  ?Skin: ?   General: Skin is warm and dry.  ?Neurological:  ?   Mental Status: He is alert and oriented to person, place, and time.  ?   Motor: Motor function is intact.  ?   Gait: Gait is intact.  ?   Deep Tendon Reflexes: Reflexes are normal and symmetric.  ?Psychiatric:     ?   Attention and Perception:  Attention normal.     ?   Mood and Affect: Mood normal.     ?   Thought Content: Thought content normal.  ? ? ?Wt Readings from Last 3 Encounters:  ?03/19/22 203 lb (92.1 kg)  ?03/15/21 198 lb (89.8 kg)  ?02/02/20 204 lb (92.5 kg)  ? ? ?BP 124/78   Pulse 66  Ht '5\' 11"'$  (1.803 m)   Wt 203 lb (92.1 kg)   SpO2 97%   BMI 28.31 kg/m?  ? ?Assessment and Plan: ?1. Annual physical exam ?Normal exam: continue healthy diet and exercise. ?Up to date on screenings and immunizations. ?- CBC with Differential/Platelet ?- Comprehensive metabolic panel ?- TSH ? ?2. Multiple sclerosis ([place]) ?Followed by Neurology and doing very well ?CBC and CMP today ? ?3. Mixed dyslipidemia ?Will check labs and advise ?- Lipid panel ? ?4. BPH with elevated PSA ?No symptoms noted.  Seen by Urology with no recommendations for medication or bx. ?Will repeat PSA today. ?- PSA ? ? ?Partially dictated using Editor, commissioning. Any errors are unintentional. ? ?Halina Maidens, MD ?[facility] ?[facility] ? ?03/19/2022 ? ? ? ? ?

## 2022-03-20 ENCOUNTER — Other Ambulatory Visit (HOSPITAL_COMMUNITY): Payer: Self-pay

## 2022-03-20 LAB — CBC WITH DIFFERENTIAL/PLATELET
Basophils Absolute: 0 10*3/uL (ref 0.0–0.2)
Basos: 1 %
EOS (ABSOLUTE): 0.1 10*3/uL (ref 0.0–0.4)
Eos: 3 %
Hematocrit: 47.8 % (ref 37.5–51.0)
Hemoglobin: 16 g/dL (ref 13.0–17.7)
Immature Grans (Abs): 0 10*3/uL (ref 0.0–0.1)
Immature Granulocytes: 0 %
Lymphocytes Absolute: 1.5 10*3/uL (ref 0.7–3.1)
Lymphs: 28 %
MCH: 28.5 pg (ref 26.6–33.0)
MCHC: 33.5 g/dL (ref 31.5–35.7)
MCV: 85 fL (ref 79–97)
Monocytes Absolute: 0.6 10*3/uL (ref 0.1–0.9)
Monocytes: 11 %
Neutrophils Absolute: 3.1 10*3/uL (ref 1.4–7.0)
Neutrophils: 57 %
Platelets: 260 10*3/uL (ref 150–450)
RBC: 5.61 x10E6/uL (ref 4.14–5.80)
RDW: 12.1 % (ref 11.6–15.4)
WBC: 5.3 10*3/uL (ref 3.4–10.8)

## 2022-03-20 LAB — COMPREHENSIVE METABOLIC PANEL
ALT: 23 IU/L (ref 0–44)
AST: 21 IU/L (ref 0–40)
Albumin/Globulin Ratio: 1.7 (ref 1.2–2.2)
Albumin: 4.4 g/dL (ref 3.8–4.9)
Alkaline Phosphatase: 61 IU/L (ref 44–121)
BUN/Creatinine Ratio: 13 (ref 9–20)
BUN: 12 mg/dL (ref 6–24)
Bilirubin Total: 0.4 mg/dL (ref 0.0–1.2)
CO2: 25 mmol/L (ref 20–29)
Calcium: 9.4 mg/dL (ref 8.7–10.2)
Chloride: 103 mmol/L (ref 96–106)
Creatinine, Ser: 0.96 mg/dL (ref 0.76–1.27)
Globulin, Total: 2.6 g/dL (ref 1.5–4.5)
Glucose: 91 mg/dL (ref 70–99)
Potassium: 4.3 mmol/L (ref 3.5–5.2)
Sodium: 139 mmol/L (ref 134–144)
Total Protein: 7 g/dL (ref 6.0–8.5)
eGFR: 91 mL/min/{1.73_m2} (ref >59)

## 2022-03-20 LAB — LIPID PANEL
Chol/HDL Ratio: 5.3 ratio — ABNORMAL HIGH (ref 0.0–5.0)
Cholesterol, Total: 184 mg/dL (ref 100–199)
HDL: 35 mg/dL — ABNORMAL LOW (ref >39)
LDL Chol Calc (NIH): 132 mg/dL — ABNORMAL HIGH (ref 0–99)
Triglycerides: 90 mg/dL (ref 0–149)
VLDL Cholesterol Cal: 17 mg/dL (ref 5–40)

## 2022-03-20 LAB — PSA: Prostate Specific Ag, Serum: 2.8 ng/mL (ref 0.0–4.0)

## 2022-03-20 LAB — TSH: TSH: 2.97 u[IU]/mL (ref 0.450–4.500)

## 2022-04-11 ENCOUNTER — Other Ambulatory Visit (HOSPITAL_COMMUNITY): Payer: Self-pay

## 2022-04-17 ENCOUNTER — Other Ambulatory Visit (HOSPITAL_COMMUNITY): Payer: Self-pay

## 2022-05-01 IMAGING — MR MR HEAD W/O CM
12 series · 40 of 48 positions shown · non-contrast
Comparison: Report of [REDACTED] brain MRI 11/23/2012, [DATE]
(no images available).

CLINICAL DATA: 59-year-old male with multiple sclerosis.

EXAM:
MRI HEAD WITHOUT CONTRAST
TECHNIQUE: Multiplanar, multiecho pulse sequences of the brain and surrounding
structures were obtained without intravenous contrast.

[Series 5: ax dwi_tracew · axial · 3.0mm · 0.60mm/px · z∈[-59,+92]mm · 4 of 48 slices shown]
[im 1/48]
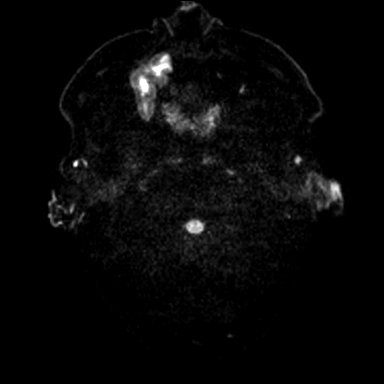
[im 16/48]
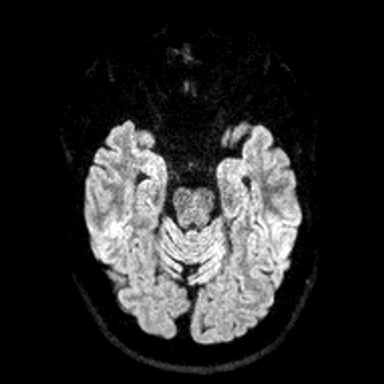
[im 32/48]
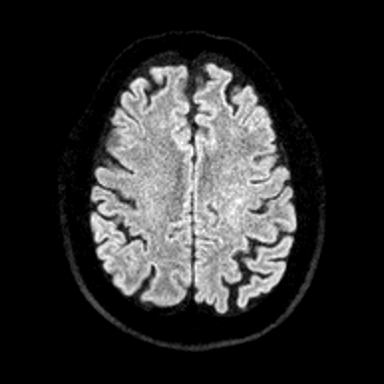
[im 48/48]
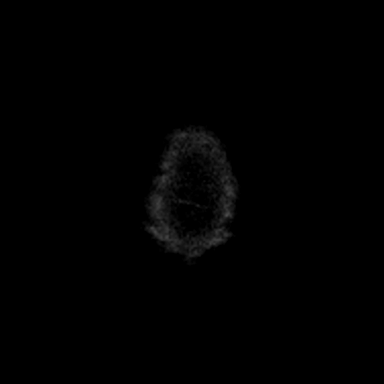

[Series 6: ax dwi_adc · axial · 3.0mm · 0.60mm/px · z∈[-59,+92]mm · 4 of 48 slices shown]
[im 1/48]
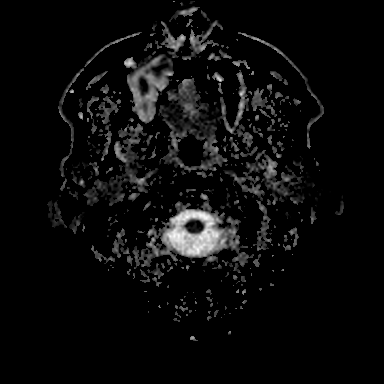
[im 16/48]
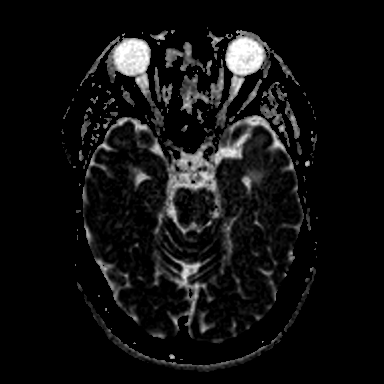
[im 32/48]
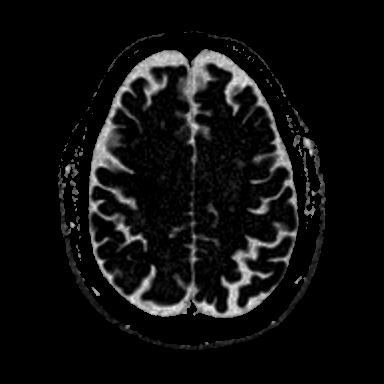
[im 48/48]
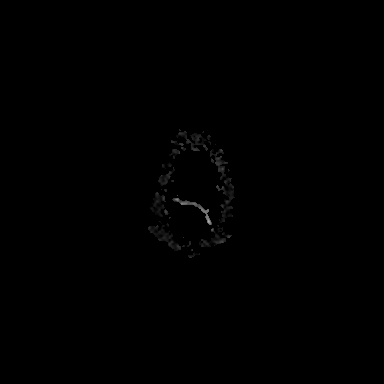

[Series 7: cor dwi_tracew · coronal · 5.0mm · 0.60mm/px · 3 of 40 slices shown]
[im 1/40]
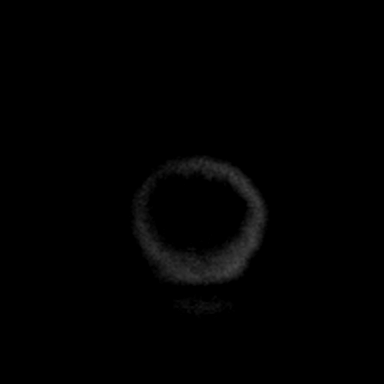
[im 20/40]
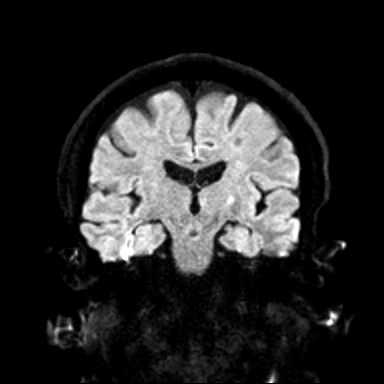
[im 40/40]
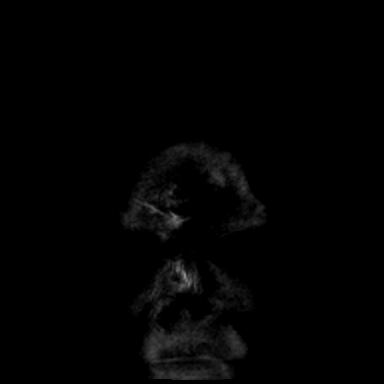

[Series 8: cor dwi_adc · coronal · 5.0mm · 0.60mm/px · 3 of 40 slices shown]
[im 1/40]
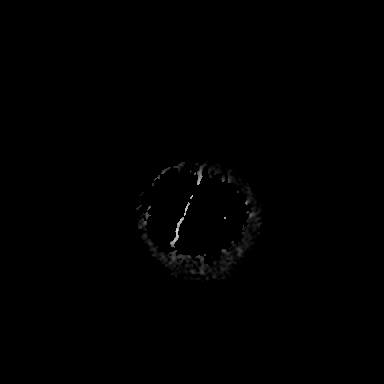
[im 20/40]
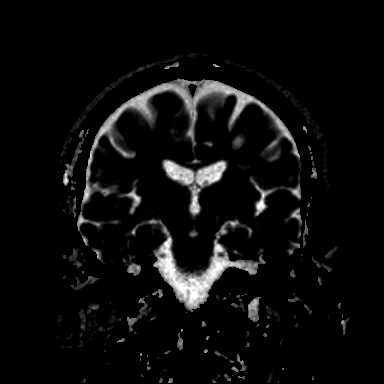
[im 40/40]
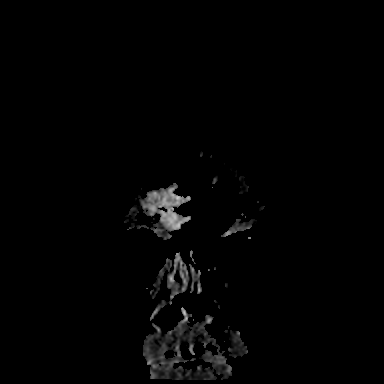

[Series 9: T1 · sagittal · 5.0mm · 0.62mm/px · 2 of 23 slices shown (1 of 2)]
[im 1/23]
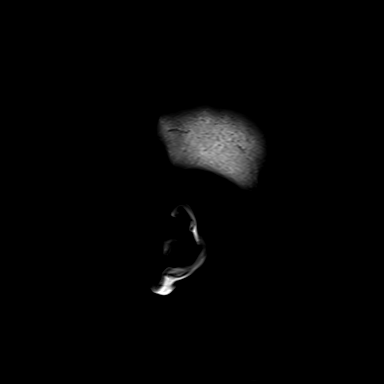
[im 23/23]
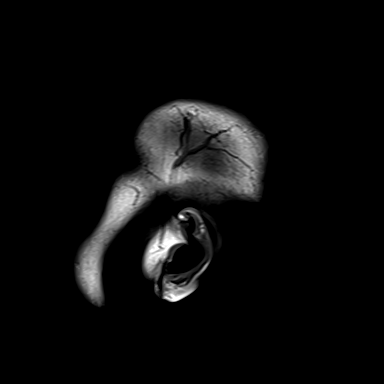

[Series 10: FLAIR · sagittal · 5.0mm · 0.94mm/px · 2 of 24 slices shown (1 of 2)]
[im 1/24]
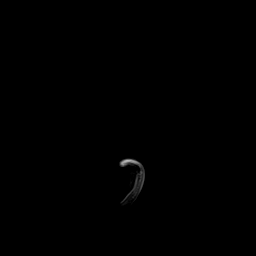
[im 24/24]
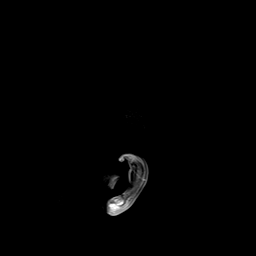

[Series 11: T2 · axial · 5.0mm · 0.53mm/px · z∈[-58,+88]mm · 2 of 26 slices shown (1 of 2)]
[im 1/26]
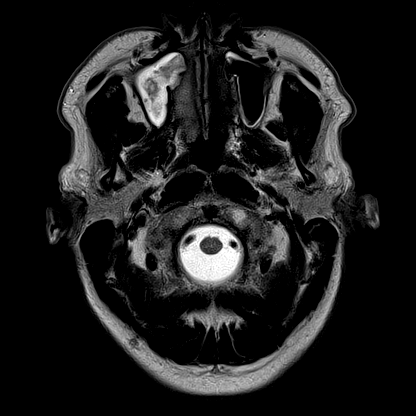
[im 26/26]
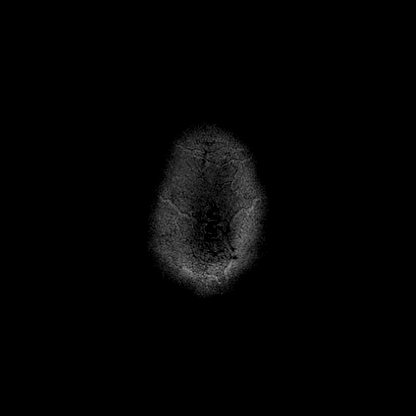

[Series 13: pha_images · axial · 3.0mm · 0.90mm/px · z∈[-71,+93]mm · 4 of 57 slices shown]
[im 1/57]
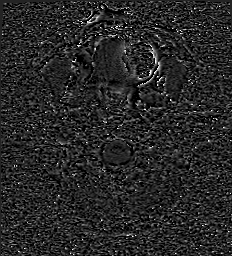
[im 19/57]
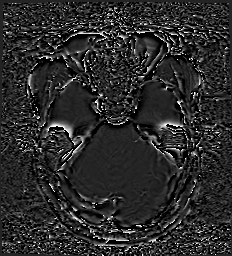
[im 38/57]
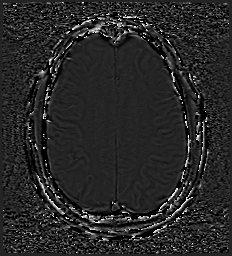
[im 57/57]
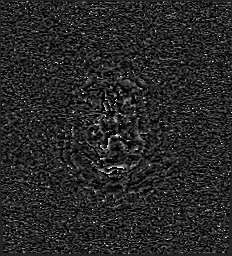

[Series 14: swi_images · axial · 3.0mm · 0.90mm/px · z∈[-71,-30]mm · 2 of 60 slices shown]
[im 1/60]
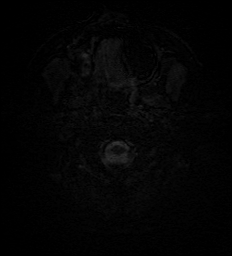
[im 15/60]
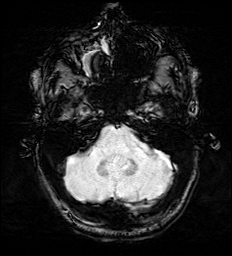

[Series 16: FLAIR · axial · 3.0mm · 0.53mm/px · z∈[-64,+94]mm · 4 of 55 slices shown (2 of 2)]
[im 1/55]
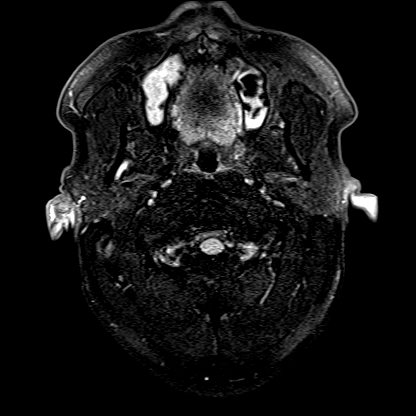
[im 19/55]
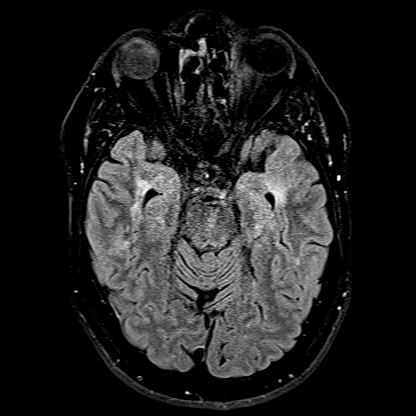
[im 37/55]
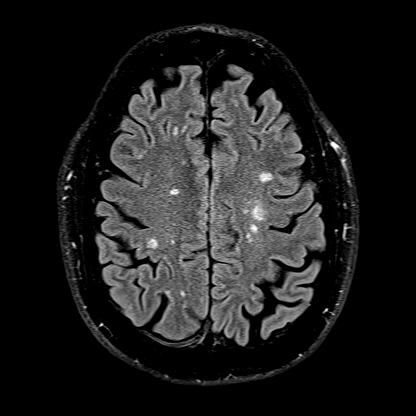
[im 55/55]
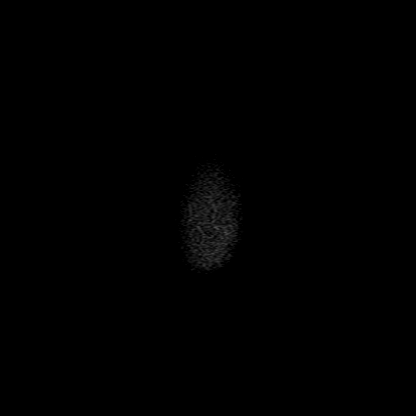

[Series 17: T1 · axial · 1.0mm · 0.98mm/px · z∈[-92,+82]mm · 8 of 175 slices shown (2 of 2)]
[im 1/175]
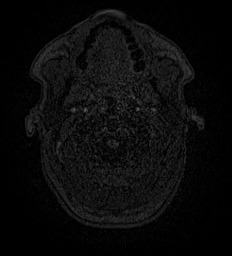
[im 30/175]
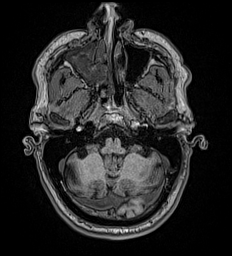
[im 59/175]
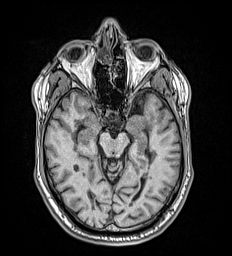
[im 73/175]
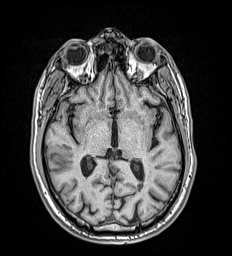
[im 102/175]
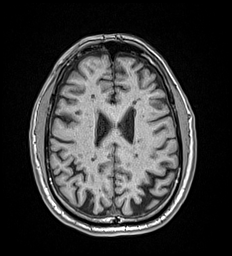
[im 117/175]
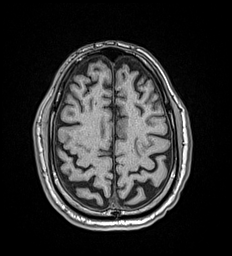
[im 146/175]
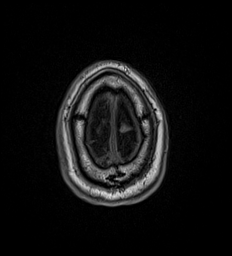
[im 175/175]
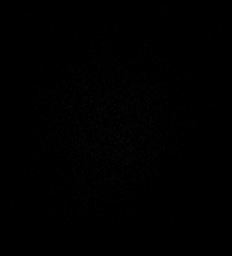

[Series 18: T2 · coronal · 5.0mm · 0.57mm/px · 2 of 29 slices shown (2 of 2)]
[im 1/29]
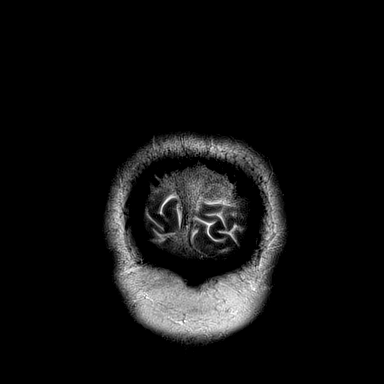
[im 29/29]
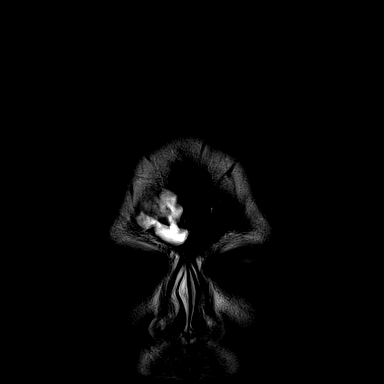

[40 of 48 positions shown; findings below may reference images not displayed]

FINDINGS: Brain: Occasional small foci of T2 shine through on DWI (right
corona radiata, left internal capsule) but no restricted diffusion.
No midline shift, mass effect, evidence of mass lesion,
ventriculomegaly, extra-axial collection or acute intracranial
hemorrhage. Cervicomedullary junction and pituitary are within
normal limits.

Widespread nodular foci of abnormal T2 and FLAIR hyperintensity in
the bilateral cerebral white matter. Numerous periventricular
lesions oriented perpendicular to the ventricles. Bilateral temporal
lobe white matter involvement. Widespread subcortical white matter
involvement. Internal capsule and deep gray nuclei involvement
greater on the left. Moderate patchy involvement also of the pons
and junction with the middle cerebellar peduncles. There is also
left side lateral medullary involvement (series 11, image 3).

Relatively preserved volume of the corpus callosum. No definite
cerebral cortex involvement. Relative sparing of the cerebellum.

No chronic cerebral blood products. No definite cortical
encephalomalacia.

Vascular: Major intracranial vascular flow voids are preserved.

Skull and upper cervical spine: Grossly negative visible cervical
spine, spinal cord. Visualized bone marrow signal is within normal
limits.

Sinuses/Orbits: Grossly negative orbits. Inspissated secretions
throughout the right maxillary sinus with superimposed scattered
bilateral sinus mucosal thickening. Opacification also the right
frontoethmoidal recess.

Other: Mastoids are clear. Visible internal auditory structures
appear normal. Visible scalp and face appear negative.
IMPRESSION: 1. Fairly advanced chronic demyelinating disease in the brain.
No evidence of acute demyelination on this noncontrast exam.
Judging from prior reports, involvement of the brainstem is new
since 7516. If any previous outside Brain MRI images can be made
available on [HOSPITAL] PACS I will compare and issue an addendum.

2. Bilateral paranasal sinus disease, most pronounced in the right
frontal and maxillary sinuses.

## 2022-05-08 ENCOUNTER — Other Ambulatory Visit (HOSPITAL_COMMUNITY): Payer: Self-pay

## 2022-05-11 ENCOUNTER — Other Ambulatory Visit (HOSPITAL_COMMUNITY): Payer: Self-pay

## 2022-05-16 ENCOUNTER — Other Ambulatory Visit (HOSPITAL_COMMUNITY): Payer: Self-pay

## 2022-05-31 ENCOUNTER — Other Ambulatory Visit (HOSPITAL_COMMUNITY): Payer: Self-pay

## 2022-06-05 ENCOUNTER — Other Ambulatory Visit (HOSPITAL_COMMUNITY): Payer: Self-pay

## 2022-06-06 ENCOUNTER — Other Ambulatory Visit (HOSPITAL_COMMUNITY): Payer: Self-pay

## 2023-03-21 ENCOUNTER — Encounter: Payer: 59 | Admitting: Internal Medicine

## 2023-06-27 ENCOUNTER — Encounter: Payer: Self-pay | Admitting: Internal Medicine

## 2023-07-04 ENCOUNTER — Encounter: Payer: 59 | Admitting: Internal Medicine

## 2023-08-14 ENCOUNTER — Encounter: Payer: 59 | Admitting: Internal Medicine
